# Patient Record
Sex: Female | Born: 1955 | Race: White | Hispanic: No | Smoking: Former smoker
Health system: Southern US, Community
[De-identification: ages and names within clinical notes are randomized; demographics above are authoritative.]

## PROBLEM LIST (undated history)

## (undated) DIAGNOSIS — I890 Lymphedema, not elsewhere classified: Secondary | ICD-10-CM

## (undated) DIAGNOSIS — E039 Hypothyroidism, unspecified: Secondary | ICD-10-CM

## (undated) DIAGNOSIS — I10 Essential (primary) hypertension: Secondary | ICD-10-CM

## (undated) DIAGNOSIS — R7303 Prediabetes: Secondary | ICD-10-CM

## (undated) DIAGNOSIS — G629 Polyneuropathy, unspecified: Secondary | ICD-10-CM

## (undated) DIAGNOSIS — I878 Other specified disorders of veins: Secondary | ICD-10-CM

## (undated) DIAGNOSIS — E785 Hyperlipidemia, unspecified: Secondary | ICD-10-CM

## (undated) DIAGNOSIS — R8789 Other abnormal findings in specimens from female genital organs: Principal | ICD-10-CM

## (undated) HISTORY — DX: Hyperlipidemia, unspecified: E78.5

## (undated) HISTORY — DX: Hypothyroidism, unspecified: E03.9

## (undated) HISTORY — DX: Prediabetes: R73.03

## (undated) HISTORY — DX: Other abnormal findings in specimens from female genital organs: R87.89

## (undated) HISTORY — PX: LEG SURGERY: SHX1003

## (undated) HISTORY — DX: Essential (primary) hypertension: I10

## (undated) HISTORY — DX: Lymphedema, not elsewhere classified: I89.0

## (undated) HISTORY — DX: Other specified disorders of veins: I87.8

## (undated) HISTORY — DX: Polyneuropathy, unspecified: G62.9

---

## 1987-01-16 HISTORY — PX: CHOLECYSTECTOMY: SHX55

## 2013-12-07 ENCOUNTER — Ambulatory Visit: Payer: Self-pay

## 2014-09-14 ENCOUNTER — Ambulatory Visit (INDEPENDENT_AMBULATORY_CARE_PROVIDER_SITE_OTHER): Payer: Medicaid Other

## 2014-09-14 ENCOUNTER — Ambulatory Visit (INDEPENDENT_AMBULATORY_CARE_PROVIDER_SITE_OTHER): Payer: Medicaid Other | Admitting: Orthopedic Surgery

## 2014-09-14 VITALS — BP 125/79 | Ht 76.0 in | Wt 356.0 lb

## 2014-09-14 DIAGNOSIS — M542 Cervicalgia: Secondary | ICD-10-CM | POA: Diagnosis not present

## 2014-09-14 DIAGNOSIS — M47812 Spondylosis without myelopathy or radiculopathy, cervical region: Secondary | ICD-10-CM | POA: Diagnosis not present

## 2014-09-14 NOTE — Patient Instructions (Addendum)
Continue NSAIDS  CALL [facility] TO SCHEDULE THERAPY VISITS

## 2014-09-14 NOTE — Progress Notes (Signed)
Patient ID: Dana Koch, female   DOB: [birth year], 59 y.o.   MRN: [id number]   Chief Complaint  Patient presents with  . Torticollis    Arm and neck pain, referred by L. Strader for eval and treat.    Kalliope Riesen is a 59 y.o. female.   HPI 59 year old female presents with long history of cervical spine discomfort and pain stiffness and soreness without radicular symptoms. She has failed therapy with Tylenol and ibuprofen also had Lodine no therapy at this point. Denies any numbness tingling or weakness in her upper extremities but her pain is constant  She also ruptured her proximal biceps in June of this year. She does have neuropathy in her legs seasonal allergies chronic back pain ankle leg edema diarrhea status post cholecystectomy Review of Systems See hpi  No past medical history on file.  Allergies  Allergen Reactions  . Amoxicillin Hives  . Demerol [Meperidine] Other (See Comments)    Stops my heart    No current outpatient prescriptions on file.   No current facility-administered medications for this visit.     Physical Exam Blood pressure 125/79, height  (1.93 m), weight 356 lb (161.481 kg). Physical Exam  The patient is well developed well nourished and well groomed.   Orientation to person place and time is normal   Mood is pleasant.  Ambulatory status ambulatory with assistive device  Inspection: Cervical spine range of motion is normal with mild discomfort mild tenderness. Upper extremities shoulder elbow wrist stable bilaterally motor exam normal bilaterally skin and neurovascular exam intact bilaterally no lymphadenopathy on either axilla or supraclavicular region   Data Reviewed Order xrays yes Read xrays  cervical spondylosis Reports reviewed: no  Diagnosis  New without further work up yes Encounter Diagnoses  Name Primary?  . Neck pain   . Cervical spondylosis without myelopathy Yes      Management Recommend physical therapy  the patient is not a surgical candidate if she's not having any radicular symptoms avoid narcotics use local measures as needed. Follow-up as needed. Patient sent for PT

## 2014-09-23 ENCOUNTER — Ambulatory Visit (HOSPITAL_COMMUNITY): Payer: Medicaid Other | Attending: Orthopedic Surgery

## 2014-09-23 DIAGNOSIS — M542 Cervicalgia: Secondary | ICD-10-CM | POA: Diagnosis present

## 2014-09-23 DIAGNOSIS — R293 Abnormal posture: Secondary | ICD-10-CM | POA: Insufficient documentation

## 2014-09-23 DIAGNOSIS — M47812 Spondylosis without myelopathy or radiculopathy, cervical region: Secondary | ICD-10-CM | POA: Diagnosis not present
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
## 2014-09-23 NOTE — Patient Instructions (Signed)
Flexibility: Neck Retraction     Neck retractions: slide head straight back as if a train on rails, avoiding tilting head up or down. Hold for 3 seconds and gently relax.  Perform 3x/day.   Copyright  VHI. All rights reserved.   Scapular Retraction (Standing)   With arms at sides, pinch shoulder blades together. Repeat ____ times per set. Do ____ sets per session. Do ____ sessions per day.  http://orth.exer.us/945   Copyright  VHI. All rights reserved.     Extension over chair.  Perform with chair back at should blade level. Extend over chair 10-15 times, 2-3x per day.

## 2014-09-30 ENCOUNTER — Encounter (HOSPITAL_COMMUNITY): Payer: Medicaid Other | Admitting: Physical Therapy

## 2014-10-05 ENCOUNTER — Encounter (HOSPITAL_COMMUNITY): Payer: Medicaid Other | Admitting: Physical Therapy

## 2014-10-13 ENCOUNTER — Encounter (HOSPITAL_COMMUNITY): Payer: Medicaid Other | Admitting: Physical Therapy

## 2014-10-20 ENCOUNTER — Encounter (HOSPITAL_COMMUNITY): Payer: Medicaid Other

## 2015-02-11 ENCOUNTER — Encounter: Payer: Self-pay | Admitting: *Deleted

## 2015-02-11 ENCOUNTER — Ambulatory Visit: Payer: Medicaid Other | Admitting: Cardiovascular Disease

## 2015-02-14 ENCOUNTER — Ambulatory Visit (INDEPENDENT_AMBULATORY_CARE_PROVIDER_SITE_OTHER): Payer: Medicaid Other | Admitting: Cardiovascular Disease

## 2015-02-14 ENCOUNTER — Encounter: Payer: Self-pay | Admitting: Cardiovascular Disease

## 2015-02-14 VITALS — BP 124/94 | HR 78 | Ht 76.0 in | Wt 367.0 lb

## 2015-02-14 DIAGNOSIS — R5383 Other fatigue: Secondary | ICD-10-CM | POA: Diagnosis not present

## 2015-02-14 DIAGNOSIS — Z789 Other specified health status: Secondary | ICD-10-CM

## 2015-02-14 DIAGNOSIS — R0602 Shortness of breath: Secondary | ICD-10-CM | POA: Diagnosis not present

## 2015-02-14 DIAGNOSIS — R9431 Abnormal electrocardiogram [ECG] [EKG]: Secondary | ICD-10-CM

## 2015-02-14 DIAGNOSIS — Z889 Allergy status to unspecified drugs, medicaments and biological substances status: Secondary | ICD-10-CM | POA: Diagnosis not present

## 2015-02-14 MED ORDER — EZETIMIBE 10 MG PO TABS: 10.0000 mg | ORAL_TABLET | Freq: Every day | ORAL | Status: DC

## 2015-02-14 NOTE — Progress Notes (Signed)
Patient ID: Dana Koch, female   DOB: 03/20/1955, 60 y.o.   MRN: [id number]       CARDIOLOGY CONSULT NOTE  Patient ID: Dana Koch MRN: [id number] DOB/AGE: Jun 06, 1955 60 y.o.  Admit date: (Not on file) Primary Physician Erasmo Downer, NP  Reason for Consultation:  Shortness of breath, fatigue  HPI: The patient is a 60 year old woman with morbid obesity, hypertension, hypothyroidism, and hyperlipidemia.  I reviewed labs from 11/23/14 which showed hemoglobin normal at 12.9.  She has been experiencing shortness of breath and fatigue. She denies orthopnea and paroxysmal nocturnal dyspnea. She has chronic lymphedema. Labs on 01/13/15 showed BUN 13, creatinine 0.77 , total cholesterol 190, triglycerides 283, HDL 55, LDL 78 , TSH 3.52, free T4 1 0.13.   ECG performed in the office today demonstrates sinus rhythm with a nonspecific T wave abnormality.  She tells me all of her symptoms began when she started simvastatin. She began to feel short of breath, fatigued, and her "legs felt like lead". She had diffuse myalgias. She then stopped it altogether for 2 weeks and felt very well with no symptoms of shortness of breath and fatigue. She was then started on Lipitor approximately one month ago and her symptoms returned. She said , "I am not used to chemicals in my body". She denies exertional chest tightness period she is due to have lipids rechecked in March by her PCP.    Allergies  Allergen Reactions  . Amoxicillin Hives  . Demerol [Meperidine] Other (See Comments)    Stops my heart    Current Outpatient Prescriptions  Medication Sig Dispense Refill  . atorvastatin (LIPITOR) 20 MG tablet Take 20 mg by mouth daily.    . hydrochlorothiazide (HYDRODIURIL) 25 MG tablet Take 25 mg by mouth daily.    Marland Kitchen levothyroxine (SYNTHROID, LEVOTHROID) 175 MCG tablet Take 175 mcg by mouth daily before breakfast.     . potassium chloride SA (K-DUR,KLOR-CON) 20 MEQ tablet Take 20 mEq by mouth  daily.      No current facility-administered medications for this visit.    Past Medical History  Diagnosis Date  . Hypertension   . Hypothyroidism   . Hyperlipidemia   . Neuropathy (HCC)   . Lymphedema   . Venous stasis     Past Surgical History  Procedure Laterality Date  . Cholecystectomy  1989  . Leg surgery Left     broken left leg    Social History   Social History  . Marital Status: Married    Spouse Name: N/A  . Number of Children: N/A  . Years of Education: N/A   Occupational History  . Not on file.   Social History Main Topics  . Smoking status: Former Smoker    Types: Cigarettes    Quit date: 01/03/2013  . Smokeless tobacco: Never Used  . Alcohol Use: Not on file  . Drug Use: Not on file  . Sexual Activity: Not on file   Other Topics Concern  . Not on file   Social History Narrative     No family history of premature CAD in 1st degree relatives.  Prior to Admission medications   Medication Sig Start Date End Date Taking? Authorizing Provider  atorvastatin (LIPITOR) 20 MG tablet Take 20 mg by mouth daily.   Yes Historical Provider, MD  hydrochlorothiazide (HYDRODIURIL) 25 MG tablet Take 25 mg by mouth daily.   Yes Historical Provider, MD  levothyroxine (SYNTHROID, LEVOTHROID) 175 MCG tablet Take 175 mcg by mouth  daily before breakfast.    Yes Historical Provider, MD  potassium chloride SA (K-DUR,KLOR-CON) 20 MEQ tablet Take 20 mEq by mouth daily.    Yes Historical Provider, MD     Review of systems complete and found to be negative unless listed above in HPI     Physical exam Blood pressure 124/94, pulse 78, height  (1.93 m), weight 367 lb (166.47 kg), SpO2 92 %. General: NAD Neck: No JVD, no thyromegaly or thyroid nodule.  Lungs: Clear to auscultation bilaterally with normal respiratory effort. CV: Nondisplaced PMI. Regular rate and rhythm, normal S1/S2, no S3/S4, no murmur.  Trace periankle and pretibial edema b/l with chronic  venous stasis.    Abdomen: Obese. Neurologic: Alert and oriented x 3.  Psych: Normal affect. Extremities: No clubbing or cyanosis.  HEENT: Normal.   ECG: Most recent ECG reviewed.  Labs:  No results found for: WBC, HGB, HCT, MCV, PLT No results for input(s): NA, K, CL, CO2, BUN, CREATININE, CALCIUM, PROT, BILITOT, ALKPHOS, ALT, AST, GLUCOSE in the last 168 hours.  Invalid input(s): LABALBU No results found for: CKTOTAL, CKMB, CKMBINDEX, TROPONINI No results found for: CHOL No results found for: HDL No results found for: LDLCALC No results found for: TRIG No results found for: CHOLHDL No results found for: LDLDIRECT       Studies: No results found.  ASSESSMENT AND PLAN:  1. Exertional dyspnea and fatigue: As she did not have these symptoms prior to commencing statin therapy, it is likely that her symptoms are due to medications. I will discontinue Lipitor and start Zetia 10 mg daily. If she were to have continued symptoms at her next follow-up visit in 2 months, I would then consider stress testing. ECG demonstrates a nonspecific T wave abnormality.  2. Hyperlipidemia: As per #1, will d/c Lipitor and start Zetia as symptoms appear to be statin-related. Will need repeat lipids in 2 months.  Dispo: f/u 2 months.  Signed: Prentice Docker, M.D., F.A.C.C.  02/14/2015, 10:33 AM

## 2015-02-14 NOTE — Patient Instructions (Addendum)
   Stop Lipitor  Begin Zetia  daily - new sent to [facility] today.  Continue all other medications.   Follow up in  2 months.

## 2015-03-04 ENCOUNTER — Telehealth: Payer: Self-pay | Admitting: Cardiovascular Disease

## 2015-03-04 NOTE — Telephone Encounter (Signed)
Pt aware.

## 2015-03-04 NOTE — Telephone Encounter (Signed)
PT has SOB , muscle pain in legs with Zetia. Please advise

## 2015-03-04 NOTE — Telephone Encounter (Signed)
pls call pt concerning her Zetia and Atorvastatin she seems to be experiencing some side effects from these 2

## 2015-03-04 NOTE — Telephone Encounter (Signed)
Can stop Zetia and try red rice yeast extract.

## 2015-04-20 ENCOUNTER — Encounter: Payer: Self-pay | Admitting: Cardiovascular Disease

## 2015-04-20 ENCOUNTER — Ambulatory Visit (INDEPENDENT_AMBULATORY_CARE_PROVIDER_SITE_OTHER): Payer: Medicaid Other | Admitting: Cardiovascular Disease

## 2015-04-20 VITALS — BP 132/83 | HR 68 | Ht 76.0 in | Wt 373.0 lb

## 2015-04-20 DIAGNOSIS — Z889 Allergy status to unspecified drugs, medicaments and biological substances status: Secondary | ICD-10-CM | POA: Diagnosis not present

## 2015-04-20 DIAGNOSIS — E785 Hyperlipidemia, unspecified: Secondary | ICD-10-CM

## 2015-04-20 DIAGNOSIS — R0602 Shortness of breath: Secondary | ICD-10-CM | POA: Diagnosis not present

## 2015-04-20 DIAGNOSIS — Z789 Other specified health status: Secondary | ICD-10-CM

## 2015-04-20 DIAGNOSIS — R9431 Abnormal electrocardiogram [ECG] [EKG]: Secondary | ICD-10-CM | POA: Diagnosis not present

## 2015-04-20 DIAGNOSIS — R5383 Other fatigue: Secondary | ICD-10-CM

## 2015-04-20 NOTE — Patient Instructions (Signed)
Your physician recommends that you schedule a follow-up appointment AS NEEDED WITH DR. KONESWARAN  Your physician recommends that you continue on your current medications as directed. Please refer to the Current Medication list given to you today.  Thank you for choosing [facility]!!   

## 2015-04-20 NOTE — Progress Notes (Signed)
Patient ID: Dana Koch, female   DOB: Feb 18, 1955, 60 y.o.   MRN: [id number]      SUBJECTIVE: The patient returns for follow up for hyperlipidemia. 03/30/15 labs show TC 225, TG 234, HDL 52, LDL 126. Developed shortness of breath and fatigue with simvastatin, atorvastatin, and Zetia. "Could not find" red rice yeast extract. She now believes her symptoms are from cardiovascular deconditioning and wants to begin walking. She also eats quite a bit of sausage and wants to change her diet even more so. PCP started lisinopril but she doesn't want to take it. BP normal today on HCTZ.   Review of Systems: As per "subjective", otherwise negative.  Allergies  Allergen Reactions  . Amoxicillin Hives  . Demerol [Meperidine] Other (See Comments)    Stops my heart  . Statins Other (See Comments)    sob    Current Outpatient Prescriptions  Medication Sig Dispense Refill  . hydrochlorothiazide (HYDRODIURIL) 25 MG tablet Take 25 mg by mouth daily.    Marland Kitchen. levothyroxine (SYNTHROID, LEVOTHROID) 175 MCG tablet Take 175 mcg by mouth daily before breakfast.     . potassium chloride SA (K-DUR,KLOR-CON) 20 MEQ tablet Take 20 mEq by mouth daily.      No current facility-administered medications for this visit.    Past Medical History  Diagnosis Date  . Hypertension   . Hypothyroidism   . Hyperlipidemia   . Neuropathy (HCC)   . Lymphedema   . Venous stasis     Past Surgical History  Procedure Laterality Date  . Cholecystectomy  1989  . Leg surgery Left     broken left leg    Social History   Social History  . Marital Status: Married    Spouse Name: N/A  . Number of Children: N/A  . Years of Education: N/A   Occupational History  . Not on file.   Social History Main Topics  . Smoking status: Former Smoker -- 2.00 packs/day for 40 years    Types: Cigarettes    Start date: 12/04/1972    Quit date: 01/03/2013  . Smokeless tobacco: Never Used  . Alcohol Use: Not on file  . Drug Use:  Not on file  . Sexual Activity: Not on file   Other Topics Concern  . Not on file   Social History Narrative     Filed Vitals:   04/20/15 0937  BP: 132/83  Pulse: 68  Height: 6\' 4"  (1.93 m)  Weight: 373 lb (169.192 kg)    PHYSICAL EXAM General: NAD Neck: No JVD, no thyromegaly or thyroid nodule.  Lungs: Clear to auscultation bilaterally with normal respiratory effort. CV: Nondisplaced PMI. Regular rate and rhythm, normal S1/S2, no S3/S4, no murmur. Trace periankle and pretibial edema b/l with chronic venous stasis.  Abdomen: Obese. Neurologic: Alert and oriented x 3.  Psych: Normal affect. Extremities: No clubbing or cyanosis.  HEENT: Normal.   ECG: Most recent ECG reviewed.      ASSESSMENT AND PLAN: 1. Exertional dyspnea and fatigue: Symptoms may be related to both CV deconditioning due to chronic pain as well as medications as noted above. Instructed to call me back if symptoms persist in spite of weight loss and CV conditioning. If so, would consider stress testing. Patient in agreement with plan.  2. Hyperlipidemia: Lipids reviewed above. Recommend red rice yeast extract given multiple medication intolerances. Also discussed weight loss and dietary modification.  Dispo: f/u prn.   Prentice DockerSuresh Koneswaran, M.D., F.A.C.C.

## 2015-05-02 ENCOUNTER — Other Ambulatory Visit: Payer: Self-pay

## 2015-05-02 DIAGNOSIS — I872 Venous insufficiency (chronic) (peripheral): Secondary | ICD-10-CM

## 2015-06-20 ENCOUNTER — Encounter: Payer: Medicaid Other | Admitting: Vascular Surgery

## 2015-06-20 ENCOUNTER — Encounter (HOSPITAL_COMMUNITY): Payer: Medicaid Other

## 2015-07-15 ENCOUNTER — Encounter (HOSPITAL_COMMUNITY): Payer: Medicaid Other

## 2015-07-15 ENCOUNTER — Encounter: Payer: Medicaid Other | Admitting: Vascular Surgery

## 2016-02-20 ENCOUNTER — Encounter (INDEPENDENT_AMBULATORY_CARE_PROVIDER_SITE_OTHER): Payer: Self-pay | Admitting: Vascular Surgery

## 2016-02-20 ENCOUNTER — Ambulatory Visit (INDEPENDENT_AMBULATORY_CARE_PROVIDER_SITE_OTHER): Payer: Medicaid Other | Admitting: Vascular Surgery

## 2016-02-20 DIAGNOSIS — M79605 Pain in left leg: Secondary | ICD-10-CM

## 2016-02-20 DIAGNOSIS — I10 Essential (primary) hypertension: Secondary | ICD-10-CM | POA: Diagnosis not present

## 2016-02-20 DIAGNOSIS — I872 Venous insufficiency (chronic) (peripheral): Secondary | ICD-10-CM | POA: Insufficient documentation

## 2016-02-20 DIAGNOSIS — M79606 Pain in leg, unspecified: Secondary | ICD-10-CM | POA: Insufficient documentation

## 2016-02-20 DIAGNOSIS — I890 Lymphedema, not elsewhere classified: Secondary | ICD-10-CM | POA: Diagnosis not present

## 2016-02-20 DIAGNOSIS — M79604 Pain in right leg: Secondary | ICD-10-CM | POA: Diagnosis not present

## 2016-02-20 DIAGNOSIS — E039 Hypothyroidism, unspecified: Secondary | ICD-10-CM | POA: Insufficient documentation

## 2016-02-20 NOTE — Progress Notes (Signed)
MRN : [id number]  Dana Koch is a 61 y.o. ([birth year]) female who presents with chief complaint of  Chief Complaint  Patient presents with  . Re-evaluation    6 month follow up  .  History of Present Illness: The patient returns to the office for followup evaluation regarding leg swelling. The swelling has persisted and the pain associated with swelling continues. There have not been any interval development of a ulcerations or wounds.  Since the previous visit the patient has been wearing graduated compression stockings and has noted significant improvement in the lymphedema.  Duplex ultrasound of the lower extremities shows normal deep venous system, superficial reflux is not present.   Current Meds  Medication Sig  . hydrochlorothiazide (HYDRODIURIL) 25 MG tablet Take 25 mg by mouth daily.  Marland Kitchen ibuprofen (ADVIL,MOTRIN) 600 MG tablet Take 600 mg by mouth.  . levothyroxine (SYNTHROID, LEVOTHROID) 175 MCG tablet Take 175 mcg by mouth daily before breakfast.   . potassium chloride SA (K-DUR,KLOR-CON) 20 MEQ tablet Take 20 mEq by mouth daily.     Past Medical History:  Diagnosis Date  . Hyperlipidemia   . Hypertension   . Hypothyroidism   . Lymphedema   . Neuropathy (HCC)   . Venous stasis     Past Surgical History:  Procedure Laterality Date  . CHOLECYSTECTOMY  1989  . LEG SURGERY Left    broken left leg    Social History Social History  Substance Use Topics  . Smoking status: Former Smoker    Packs/day: 2.00    Years: 40.00    Types: Cigarettes    Start date: 12/04/1972    Quit date: 01/03/2013  . Smokeless tobacco: Never Used  . Alcohol use Not on file    Family History Family History  Problem Relation Age of Onset  . Thyroid disease Mother   . Bone cancer Mother   . Thyroid disease Father   . Emphysema Father   . Diabetes Father   . Neuropathy Father   . Diabetes Brother   . Thyroid disease Brother   . Thyroid disease Sister   No family  history of bleeding/clotting disorders, porphyria or autoimmune disease  Allergies  Allergen Reactions  . Amoxicillin Hives  . Demerol [Meperidine] Other (See Comments)    Stops my heart  . Statins Other (See Comments)    sob     REVIEW OF SYSTEMS (Negative unless checked)  Constitutional: [] Weight loss  [] Fever  [] Chills Cardiac: [] Chest pain   [] Chest pressure   [] Palpitations   [] Shortness of breath when laying flat   [] Shortness of breath with exertion. Vascular:  [] Pain in legs with walking   [] Pain in legs at rest  [] History of DVT   [] Phlebitis   [x] Swelling in legs   [] Varicose veins   [] Non-healing ulcers Pulmonary:   [] Uses home oxygen   [] Productive cough   [] Hemoptysis   [] Wheeze  [] COPD   [] Asthma Neurologic:  [] Dizziness   [] Seizures   [] History of stroke   [] History of TIA  [] Aphasia   [] Vissual changes   [] Weakness or numbness in arm   [] Weakness or numbness in leg Musculoskeletal:   [] Joint swelling   [] Joint pain   [] Low back pain Hematologic:  [] Easy bruising  [] Easy bleeding   [] Hypercoagulable state   [] Anemic Gastrointestinal:  [] Diarrhea   [] Vomiting  [] Gastroesophageal reflux/heartburn   [] Difficulty swallowing. Genitourinary:  [] Chronic kidney disease   [] Difficult urination  [] Frequent urination   [] Blood in urine Skin:  []   Rashes   [] Ulcers  Psychological:  [] History of anxiety   []  History of major depression.  Physical Examination  Vitals:   02/20/16 1018  BP: (!) 147/94  Pulse: 91  Resp: 16  Weight: (!) 363 lb (164.7 kg)  Height: 6\' 3"  (1.905 m)   Body mass index is 45.37 kg/m. Gen: WD/WN, NAD Head: [place]/AT, No temporalis wasting.  Ear/Nose/Throat: Hearing grossly intact, nares w/o erythema or drainage, poor dentition Eyes: PER, EOMI, sclera nonicteric.  Neck: Supple, no masses.  No bruit or JVD.  Pulmonary:  Good air movement, clear to auscultation bilaterally, no use of accessory muscles.  Cardiac: RRR, normal S1, S2, no Murmurs. Vascular: 2+  edema bilaterally mild venous stasis skin changes Vessel Right Left  Radial Palpable Palpable  Ulnar Palpable Palpable  Brachial Palpable Palpable  Carotid Palpable Palpable  Femoral Palpable Palpable  Popliteal Palpable Palpable  PT Palpable Palpable  DP Palpable Palpable   Gastrointestinal: soft, non-distended. No guarding/no peritoneal signs.  Musculoskeletal: M/S 5/5 throughout.  No deformity or atrophy.  Neurologic: CN 2-12 intact. Pain and light touch intact in extremities.  Symmetrical.  Speech is fluent. Motor exam as listed above. Psychiatric: Judgment intact, Mood & affect appropriate for pt's clinical situation. Dermatologic: No rashes or ulcers noted.  No changes consistent with cellulitis. Lymph : No Cervical lymphadenopathy, no lichenification or skin changes of chronic lymphedema.  CBC No results found for: WBC, HGB, HCT, MCV, PLT  BMET No results found for: NA, K, CL, CO2, GLUCOSE, BUN, CREATININE, CALCIUM, GFRNONAA, GFRAA CrCl cannot be calculated (No order found.).  COAG No results found for: INR, PROTIME  Radiology No results found.  Assessment/Plan 1. Lymph edema  No surgery or intervention at this point in time.    I have reviewed my discussion with the patient regarding lymphedema and why it  causes symptoms.  Patient will continue wearing graduated compression stockings class 1 (20-30 mmHg) on a daily basis a prescription was given. The patient is reminded to put the stockings on first thing in the morning and removing them in the evening. The patient is instructed specifically not to sleep in the stockings.   In addition, behavioral modification throughout the day will be continued.  This will include frequent elevation (such as in a recliner), use of over the counter pain medications as needed and exercise such as walking.  I have reviewed systemic causes for chronic edema such as liver, kidney and cardiac etiologies and there does not appear to be any  significant changes in these organ systems over the past year.  The patient is under the impression that these organ systems are all stable and unchanged.    The patient will continue aggressive use of the  lymph pump.  This will continue to improve the edema control and prevent sequela such as ulcers and infections.   The patient will follow-up with me on an annual basis.    2. Chronic venous insufficiency See #1  3. Pain in both lower extremities See #1  4. Essential hypertension Continue antihypertensive medications as already ordered, these medications have been reviewed and there are no changes at this time.   5. Hypothyroidism, unspecified type Continue Synthroid as already ordered, these medications have been reviewed and there are no changes at this time.   Levora DredgeGregory Schnier, MD  02/20/2016 10:35 AM

## 2016-02-29 ENCOUNTER — Encounter: Payer: Self-pay | Admitting: Family Medicine

## 2016-02-29 ENCOUNTER — Ambulatory Visit (INDEPENDENT_AMBULATORY_CARE_PROVIDER_SITE_OTHER): Payer: Medicaid Other | Admitting: Family Medicine

## 2016-02-29 VITALS — BP 135/76 | HR 79 | Temp 96.9°F | Ht 75.0 in | Wt 365.8 lb

## 2016-02-29 DIAGNOSIS — E039 Hypothyroidism, unspecified: Secondary | ICD-10-CM | POA: Diagnosis not present

## 2016-02-29 DIAGNOSIS — I10 Essential (primary) hypertension: Secondary | ICD-10-CM

## 2016-02-29 DIAGNOSIS — E785 Hyperlipidemia, unspecified: Secondary | ICD-10-CM

## 2016-02-29 NOTE — Progress Notes (Signed)
BP 135/76   Pulse 79   Temp (!) 96.9 F (36.1 C) (Oral)   Ht 6' 3"  (1.905 m)   Wt (!) 365 lb 12.8 oz (165.9 kg)   BMI 45.72 kg/m    Subjective:    Patient ID: Dana Koch, female    DOB: 13-Mar-1955, 61 y.o.   MRN: [id number]  HPI: Dana Koch is a 61 y.o. female presenting on 02/29/2016 for New Patient (Initial Visit)   HPI Hypertension check Patient is coming in today for a hypertension check and to establish care with our office as a new patient. Her blood pressure today is 135/76. She has been on hydrochlorothiazide prior to coming here and denies any issues with it. Patient denies headaches, blurred vision, chest pains, shortness of breath, or weakness. Denies any side effects from medication and is content with current medication.   Hypothyroidism Patient is coming in for a checkup on hypothyroidism and is currently on levothyroxine 200 g. She has been on that for at least a year. She has not had her labs checked in at least a year. She is having some fatigue but denies any palpitations or constipation or diarrhea or chest pain or changes in her hair or nails  Hyperlipidemia Patient is currently on red rice yeast extract for her cholesterol. She denies any issue with chest pain or focal numbness or weakness or visual deficits that are new.  Relevant past medical, surgical, family and social history reviewed and updated as indicated. Interim medical history since our last visit reviewed. Allergies and medications reviewed and updated.  Review of Systems  Constitutional: Negative for chills and fever.  HENT: Negative for congestion, ear discharge and ear pain.   Eyes: Negative for visual disturbance.  Respiratory: Negative for chest tightness and shortness of breath.   Cardiovascular: Negative for chest pain and leg swelling.  Musculoskeletal: Negative for back pain and gait problem.  Skin: Negative for rash.  Neurological: Negative for dizziness, light-headedness and  headaches.  Psychiatric/Behavioral: Negative for agitation and behavioral problems.  All other systems reviewed and are negative.  Per HPI unless specifically indicated above  Social History   Social History  . Marital status: Married    Spouse name: N/A  . Number of children: N/A  . Years of education: N/A   Occupational History  . Not on file.   Social History Main Topics  . Smoking status: Former Smoker    Packs/day: 3.00    Years: 40.00    Types: Cigarettes    Start date: 12/04/1972    Quit date: 01/03/2013  . Smokeless tobacco: Never Used  . Alcohol use No  . Drug use: No  . Sexual activity: No   Other Topics Concern  . Not on file   Social History Narrative  . No narrative on file    Past Surgical History:  Procedure Laterality Date  . CHOLECYSTECTOMY  1989  . LEG SURGERY Left    broken left leg    Family History  Problem Relation Age of Onset  . Thyroid disease Mother   . Bone cancer Mother   . Thyroid disease Father   . Emphysema Father   . Diabetes Father   . Neuropathy Father   . Diabetes Brother   . Thyroid disease Brother   . Thyroid disease Sister         Objective:    BP 135/76   Pulse 79   Temp (!) 96.9 F (36.1 C) (Oral)  Ht 6' 3"  (1.905 m)   Wt (!) 365 lb 12.8 oz (165.9 kg)   BMI 45.72 kg/m   Wt Readings from Last 3 Encounters:  02/29/16 (!) 365 lb 12.8 oz (165.9 kg)  02/20/16 (!) 363 lb (164.7 kg)  04/20/15 (!) 373 lb (169.2 kg)    Physical Exam  Constitutional: She is oriented to person, place, and time. She appears well-developed and well-nourished. No distress.  Eyes: Conjunctivae are normal.  Neck: Neck supple. No thyromegaly present.  Cardiovascular: Normal rate, regular rhythm, normal heart sounds and intact distal pulses.   No murmur heard. Pulmonary/Chest: Effort normal and breath sounds normal. No respiratory distress. She has no wheezes.  Abdominal: Soft. Bowel sounds are normal. She exhibits no distension.  There is no tenderness. There is no rebound.  Musculoskeletal: Normal range of motion. She exhibits no edema or tenderness.  Lymphadenopathy:    She has no cervical adenopathy.  Neurological: She is alert and oriented to person, place, and time. Coordination normal.  Skin: Skin is warm and dry. No rash noted. She is not diaphoretic.  Psychiatric: She has a normal mood and affect. Her behavior is normal.  Nursing note and vitals reviewed.   No results found for this or any previous visit.    Assessment & Plan:   Problem List Items Addressed This Visit      Cardiovascular and Mediastinum   Essential hypertension - Primary   Relevant Orders   CMP14+EGFR     Endocrine   Hypothyroidism   Relevant Medications   levothyroxine (SYNTHROID, LEVOTHROID) 200 MCG tablet   Other Relevant Orders   TSH    Other Visit Diagnoses    Hyperlipidemia LDL goal <130       Relevant Orders   Lipid panel       Follow up plan: Return in about 6 months (around 08/28/2016), or if symptoms worsen or fail to improve, for hypothyroidism and cholesterol.  Caryl Pina, MD [facility] 02/29/2016, 9:42 AM

## 2016-03-01 LAB — CMP14+EGFR
A/G RATIO: 1.6 (ref 1.2–2.2)
ALT: 20 IU/L (ref 0–32)
AST: 19 IU/L (ref 0–40)
Albumin: 4.4 g/dL (ref 3.5–5.5)
Alkaline Phosphatase: 107 IU/L (ref 39–117)
BILIRUBIN TOTAL: 0.4 mg/dL (ref 0.0–1.2)
BUN/Creatinine Ratio: 14 (ref 9–23)
BUN: 11 mg/dL (ref 6–24)
CALCIUM: 9.9 mg/dL (ref 8.7–10.2)
CHLORIDE: 97 mmol/L (ref 96–106)
CO2: 28 mmol/L (ref 18–29)
Creatinine, Ser: 0.76 mg/dL (ref 0.57–1.00)
GFR calc Af Amer: 99 mL/min/{1.73_m2} (ref >59)
GFR, EST NON AFRICAN AMERICAN: 86 mL/min/{1.73_m2} (ref >59)
GLOBULIN, TOTAL: 2.8 g/dL (ref 1.5–4.5)
Glucose: 98 mg/dL (ref 65–99)
POTASSIUM: 4.4 mmol/L (ref 3.5–5.2)
SODIUM: 142 mmol/L (ref 134–144)
TOTAL PROTEIN: 7.2 g/dL (ref 6.0–8.5)

## 2016-03-01 LAB — LIPID PANEL
Chol/HDL Ratio: 4.4 ratio units (ref 0.0–4.4)
Cholesterol, Total: 206 mg/dL — ABNORMAL HIGH (ref 100–199)
HDL: 47 mg/dL (ref >39)
LDL Calculated: 119 mg/dL — ABNORMAL HIGH (ref 0–99)
Triglycerides: 202 mg/dL — ABNORMAL HIGH (ref 0–149)
VLDL Cholesterol Cal: 40 mg/dL (ref 5–40)

## 2016-03-01 LAB — TSH: TSH: 0.959 u[IU]/mL (ref 0.450–4.500)

## 2016-03-30 ENCOUNTER — Other Ambulatory Visit: Payer: Self-pay

## 2016-03-30 MED ORDER — POTASSIUM CHLORIDE CRYS ER 20 MEQ PO TBCR: 20.0000 meq | EXTENDED_RELEASE_TABLET | Freq: Every day | ORAL | 1 refills | Status: DC

## 2016-05-07 ENCOUNTER — Other Ambulatory Visit: Payer: Self-pay | Admitting: Family Medicine

## 2016-06-01 ENCOUNTER — Other Ambulatory Visit: Payer: Self-pay | Admitting: Family Medicine

## 2016-06-08 ENCOUNTER — Telehealth: Payer: Self-pay

## 2016-06-08 ENCOUNTER — Other Ambulatory Visit: Payer: Self-pay

## 2016-06-08 MED ORDER — HYDROCHLOROTHIAZIDE 25 MG PO TABS: 25.0000 mg | ORAL_TABLET | Freq: Every day | ORAL | 1 refills | Status: DC

## 2016-06-08 NOTE — Telephone Encounter (Signed)
error 

## 2016-06-15 ENCOUNTER — Telehealth: Payer: Self-pay | Admitting: Family Medicine

## 2016-06-15 NOTE — Telephone Encounter (Signed)
Denied.

## 2016-06-27 ENCOUNTER — Other Ambulatory Visit: Payer: Self-pay | Admitting: Family Medicine

## 2016-06-27 ENCOUNTER — Encounter: Payer: Self-pay | Admitting: Family Medicine

## 2016-06-27 ENCOUNTER — Encounter: Payer: Self-pay | Admitting: *Deleted

## 2016-06-28 MED ORDER — POTASSIUM CHLORIDE CRYS ER 20 MEQ PO TBCR: 20.0000 meq | EXTENDED_RELEASE_TABLET | Freq: Every day | ORAL | 2 refills | Status: DC

## 2016-08-29 ENCOUNTER — Ambulatory Visit: Payer: Medicaid Other | Admitting: Family Medicine

## 2016-08-30 ENCOUNTER — Ambulatory Visit (INDEPENDENT_AMBULATORY_CARE_PROVIDER_SITE_OTHER): Payer: Medicaid Other | Admitting: Family Medicine

## 2016-08-30 ENCOUNTER — Encounter: Payer: Self-pay | Admitting: Family Medicine

## 2016-08-30 VITALS — BP 133/77 | HR 66 | Temp 97.6°F | Ht 75.0 in | Wt 362.0 lb

## 2016-08-30 DIAGNOSIS — Z13220 Encounter for screening for lipoid disorders: Secondary | ICD-10-CM

## 2016-08-30 DIAGNOSIS — E039 Hypothyroidism, unspecified: Secondary | ICD-10-CM

## 2016-08-30 DIAGNOSIS — I890 Lymphedema, not elsewhere classified: Secondary | ICD-10-CM | POA: Diagnosis not present

## 2016-08-30 DIAGNOSIS — I10 Essential (primary) hypertension: Secondary | ICD-10-CM

## 2016-08-30 DIAGNOSIS — R7309 Other abnormal glucose: Secondary | ICD-10-CM

## 2016-08-30 MED ORDER — FUROSEMIDE 20 MG PO TABS: 20.0000 mg | ORAL_TABLET | ORAL | 3 refills | Status: DC | PRN

## 2016-08-30 MED ORDER — LEVOTHYROXINE SODIUM 200 MCG PO TABS: 200.0000 ug | ORAL_TABLET | Freq: Every day | ORAL | 3 refills | Status: DC

## 2016-08-30 MED ORDER — POTASSIUM CHLORIDE CRYS ER 20 MEQ PO TBCR: 20.0000 meq | EXTENDED_RELEASE_TABLET | Freq: Every day | ORAL | 2 refills | Status: DC

## 2016-08-30 MED ORDER — HYDROCHLOROTHIAZIDE 12.5 MG PO TABS: 12.5000 mg | ORAL_TABLET | Freq: Every day | ORAL | 3 refills | Status: DC

## 2016-08-30 NOTE — Progress Notes (Signed)
BP 133/77   Pulse 66   Temp 97.6 F (36.4 C) (Oral)   Ht '6\' 3"'$  (1.905 m)   Wt (!) 362 lb (164.2 kg)   BMI 45.25 kg/m    Subjective:    Patient ID: Dana Koch, female    DOB: [birth year], 61 y.o.   MRN: [id number]  HPI: Dana Koch is a 61 y.o. female presenting on 08/30/2016 for Hypothyroidism (6 mo; would like to be tested for Hashimoto's, daughter and siser have been diagnosed) and Hypertension (episodes of hypotension )  HPI Hypothyroidism recheck Patient is coming in for thyroid recheck today as well. They deny any issues with hair changes or heat or cold problems or diarrhea or constipation. They deny any chest pain or palpitations. They are currently on levothyroxine 200 micrograms   Hypertension Patient is currently on hydrochlorothiazide, and their blood pressure today is 133/77. Patient denies any lightheadedness or dizziness. Patient denies headaches, blurred vision, chest pains, shortness of breath, or weakness. Denies any side effects from medication and is content with current medication.   Leg swelling/lymphedema recheck Patient is coming in today with complaints of leg swelling. She has been trying compression stockings although she does admit that she is not wearing them throughout the day of the time but does work in the morning and then gives off in the afternoon. She is trying to elevate her feet in the evenings as much she can but they still swell and she has a lot of pain in both of them from the swelling in the afternoons and evenings.  Relevant past medical, surgical, family and social history reviewed and updated as indicated. Interim medical history since our last visit reviewed. Allergies and medications reviewed and updated.  Review of Systems  Constitutional: Negative for chills and fever.  Eyes: Negative for redness and visual disturbance.  Respiratory: Negative for chest tightness and shortness of breath.   Cardiovascular: Positive for leg  swelling. Negative for chest pain.  Endocrine: Negative for cold intolerance, heat intolerance, polydipsia and polyuria.  Genitourinary: Negative for difficulty urinating and dysuria.  Musculoskeletal: Negative for back pain and gait problem.  Skin: Negative for rash.  Neurological: Negative for dizziness, light-headedness and headaches.  Psychiatric/Behavioral: Negative for agitation and behavioral problems.  All other systems reviewed and are negative.   Per HPI unless specifically indicated above     Objective:    BP 133/77   Pulse 66   Temp 97.6 F (36.4 C) (Oral)   Ht '6\' 3"'$  (1.905 m)   Wt (!) 362 lb (164.2 kg)   BMI 45.25 kg/m   Wt Readings from Last 3 Encounters:  08/30/16 (!) 362 lb (164.2 kg)  02/29/16 (!) 365 lb 12.8 oz (165.9 kg)  02/20/16 (!) 363 lb (164.7 kg)    Physical Exam  Constitutional: She is oriented to person, place, and time. She appears well-developed and well-nourished. No distress.  Eyes: Conjunctivae are normal.  Neck: Neck supple. No thyromegaly present.  Cardiovascular: Normal rate, regular rhythm, normal heart sounds and intact distal pulses.   No murmur heard. Pulmonary/Chest: Effort normal and breath sounds normal. No respiratory distress. She has no wheezes.  Musculoskeletal: Normal range of motion. She exhibits edema (2+). She exhibits no tenderness.  Lymphadenopathy:    She has no cervical adenopathy.  Neurological: She is alert and oriented to person, place, and time. Coordination normal.  Skin: Skin is warm and dry. No rash noted. She is not diaphoretic.  Psychiatric: She has a normal  mood and affect. Her behavior is normal.  Nursing note and vitals reviewed.     Assessment & Plan:   Problem List Items Addressed This Visit      Cardiovascular and Mediastinum   Essential hypertension   Relevant Medications   furosemide (LASIX) 20 MG tablet   hydrochlorothiazide (HYDRODIURIL) 12.5 MG tablet   potassium chloride SA (K-DUR,KLOR-CON)  20 MEQ tablet   Other Relevant Orders   CMP14+EGFR (Completed)     Endocrine   Hypothyroidism - Primary   Relevant Orders   Thyroid Panel With TSH (Completed)     Other   Lymph edema   Relevant Medications   furosemide (LASIX) 20 MG tablet    Other Visit Diagnoses    Need for lipid screening       Relevant Orders   Lipid panel (Completed)   Elevated glucose           Follow up plan: Return in about 6 months (around 03/02/2017), or if symptoms worsen or fail to improve, for Recheck thyroid and blood pressure.  Counseling provided for all of the vaccine components Orders Placed This Encounter  Procedures  . Thyroid Panel With TSH  . CMP14+EGFR  . Lipid panel    Caryl Pina, MD [facility] 08/30/2016, 9:13 AM

## 2016-08-31 ENCOUNTER — Encounter: Payer: Self-pay | Admitting: Family Medicine

## 2016-08-31 ENCOUNTER — Other Ambulatory Visit: Payer: Self-pay | Admitting: *Deleted

## 2016-08-31 DIAGNOSIS — R7309 Other abnormal glucose: Secondary | ICD-10-CM

## 2016-08-31 LAB — CMP14+EGFR
A/G RATIO: 1.5 (ref 1.2–2.2)
ALT: 31 IU/L (ref 0–32)
AST: 33 IU/L (ref 0–40)
Albumin: 4.7 g/dL (ref 3.5–5.5)
Alkaline Phosphatase: 99 IU/L (ref 39–117)
BUN/Creatinine Ratio: 12 (ref 9–23)
BUN: 9 mg/dL (ref 6–24)
Bilirubin Total: 0.4 mg/dL (ref 0.0–1.2)
CALCIUM: 10.1 mg/dL (ref 8.7–10.2)
CO2: 28 mmol/L (ref 20–29)
CREATININE: 0.77 mg/dL (ref 0.57–1.00)
Chloride: 97 mmol/L (ref 96–106)
GFR, EST AFRICAN AMERICAN: 98 mL/min/{1.73_m2} (ref >59)
GFR, EST NON AFRICAN AMERICAN: 85 mL/min/{1.73_m2} (ref >59)
GLOBULIN, TOTAL: 3.1 g/dL (ref 1.5–4.5)
Glucose: 108 mg/dL — ABNORMAL HIGH (ref 65–99)
POTASSIUM: 4.1 mmol/L (ref 3.5–5.2)
Sodium: 142 mmol/L (ref 134–144)
TOTAL PROTEIN: 7.8 g/dL (ref 6.0–8.5)

## 2016-08-31 LAB — THYROID PANEL WITH TSH
FREE THYROXINE INDEX: 2.8 (ref 1.2–4.9)
T3 Uptake Ratio: 27 % (ref 24–39)
T4, Total: 10.3 ug/dL (ref 4.5–12.0)
TSH: 1.85 u[IU]/mL (ref 0.450–4.500)

## 2016-08-31 LAB — LIPID PANEL
CHOL/HDL RATIO: 4.2 ratio (ref 0.0–4.4)
Cholesterol, Total: 220 mg/dL — ABNORMAL HIGH (ref 100–199)
HDL: 52 mg/dL (ref >39)
LDL CALC: 136 mg/dL — AB (ref 0–99)
Triglycerides: 162 mg/dL — ABNORMAL HIGH (ref 0–149)
VLDL Cholesterol Cal: 32 mg/dL (ref 5–40)

## 2016-09-03 ENCOUNTER — Other Ambulatory Visit: Payer: Self-pay | Admitting: Family Medicine

## 2016-09-03 DIAGNOSIS — E039 Hypothyroidism, unspecified: Secondary | ICD-10-CM

## 2016-09-03 LAB — BAYER DCA HB A1C WAIVED: HB A1C (BAYER DCA - WAIVED): 5.9 % (ref <7.0)

## 2016-09-05 ENCOUNTER — Other Ambulatory Visit: Payer: Self-pay | Admitting: *Deleted

## 2016-09-05 DIAGNOSIS — I10 Essential (primary) hypertension: Secondary | ICD-10-CM

## 2016-09-05 MED ORDER — HYDROCHLOROTHIAZIDE 12.5 MG PO TABS: 12.5000 mg | ORAL_TABLET | Freq: Every day | ORAL | 0 refills | Status: DC

## 2016-11-06 ENCOUNTER — Ambulatory Visit: Payer: Self-pay

## 2016-12-28 ENCOUNTER — Other Ambulatory Visit: Payer: Self-pay | Admitting: Family Medicine

## 2017-01-26 ENCOUNTER — Encounter: Payer: Self-pay | Admitting: Family Medicine

## 2017-02-21 ENCOUNTER — Ambulatory Visit (INDEPENDENT_AMBULATORY_CARE_PROVIDER_SITE_OTHER): Payer: Medicaid Other | Admitting: Vascular Surgery

## 2017-02-25 ENCOUNTER — Ambulatory Visit (INDEPENDENT_AMBULATORY_CARE_PROVIDER_SITE_OTHER): Payer: Medicaid Other | Admitting: Vascular Surgery

## 2017-03-04 ENCOUNTER — Ambulatory Visit: Payer: Medicaid Other | Admitting: Family Medicine

## 2017-03-04 ENCOUNTER — Encounter: Payer: Self-pay | Admitting: Family Medicine

## 2017-03-04 VITALS — BP 132/88 | HR 74 | Temp 98.5°F | Ht 75.0 in | Wt 378.0 lb

## 2017-03-04 DIAGNOSIS — I10 Essential (primary) hypertension: Secondary | ICD-10-CM | POA: Diagnosis not present

## 2017-03-04 DIAGNOSIS — E039 Hypothyroidism, unspecified: Secondary | ICD-10-CM | POA: Diagnosis not present

## 2017-03-04 NOTE — Progress Notes (Signed)
BP 132/88   Pulse 74   Temp 98.5 F (36.9 C) (Oral)   Ht _0  (1.905 m)   Wt (!) 378 lb (171.5 kg)   BMI 47.25 kg/m    Subjective:    Patient ID: Dana Koch, female    DOB: [birth year], 62 y.o.   MRN: [id number]  HPI: Dana Koch is a 62 y.o. female presenting on 03/04/2017 for Hypothyroidism (6 mo) and Hypertension   HPI Hypothyroidism recheck Patient is coming in for thyroid recheck today as well. They deny any issues with hair changes or heat or cold problems or diarrhea or constipation. They deny any chest pain or palpitations. They are currently on levothyroxine 232mcrograms   Hypertension Patient is currently on hydrochlorothiazide, and their blood pressure today is 132/80. Patient denies any lightheadedness or dizziness. Patient denies headaches, blurred vision, chest pains, shortness of breath, or weakness. Denies any side effects from medication and is content with current medication.   Relevant past medical, surgical, family and social history reviewed and updated as indicated. Interim medical history since our last visit reviewed. Allergies and medications reviewed and updated.  Review of Systems  Constitutional: Positive for unexpected weight change (Patient has been gradually gaining weight, she does admit that this is due to inactivity). Negative for chills and fever.  Eyes: Negative for visual disturbance.  Respiratory: Negative for chest tightness and shortness of breath.   Cardiovascular: Negative for chest pain and leg swelling.  Gastrointestinal: Negative for abdominal pain.  Musculoskeletal: Negative for back pain and gait problem.  Skin: Negative for rash.  Neurological: Negative for dizziness, light-headedness and headaches.  Psychiatric/Behavioral: Negative for agitation and behavioral problems.  All other systems reviewed and are negative.   Per HPI unless specifically indicated above   Allergies as of 03/04/2017      Reactions   Demerol  [meperidine] Other (See Comments)   Stops my heart   Amoxicillin Hives   Red Yeast Rice [cholestin] Itching   Statins Other (See Comments)   sob      Medication List        Accurate as of 03/04/17  8:04 AM. Always use your most recent med list.          CoQ10 400 MG Caps Take 400 mg by mouth.   furosemide 20 MG tablet Commonly known as:  LASIX Take 1 tablet (20 mg total) by mouth as needed.   hydrochlorothiazide 12.5 MG tablet Commonly known as:  HYDRODIURIL Take 1 tablet (12.5 mg total) by mouth daily.   ibuprofen 600 MG tablet Commonly known as:  ADVIL,MOTRIN Take 600 mg by mouth.   levothyroxine 200 MCG tablet Commonly known as:  SYNTHROID, LEVOTHROID TAKE 1 TABLET DAILY   Melatonin 5 MG Caps Take 5 mg by mouth at bedtime.   potassium chloride SA 20 MEQ tablet Commonly known as:  K-DUR,KLOR-CON Take 1 tablet (20 mEq total) by mouth daily.   potassium chloride SA 20 MEQ tablet Commonly known as:  K-DUR,KLOR-CON TAKE 1 TABLET ONCE DAILY   SALMON OIL-1000 PO Take 3,000 mg by mouth daily.          Objective:    BP 132/88   Pulse 74   Temp 98.5 F (36.9 C) (Oral)   Ht _1  (1.905 m)   Wt (!) 378 lb (171.5 kg)   BMI 47.25 kg/m   Wt Readings from Last 3 Encounters:  03/04/17 (!) 378 lb (171.5 kg)  08/30/16 (!) 362 lb (164.2 kg)  02/29/16 (!) 365 lb 12.8 oz (165.9 kg)    Physical Exam  Constitutional: She is oriented to person, place, and time. She appears well-developed and well-nourished. No distress.  Eyes: Conjunctivae are normal.  Neck: Neck supple. No thyromegaly present.  Cardiovascular: Normal rate, regular rhythm, normal heart sounds and intact distal pulses.  No murmur heard. Pulmonary/Chest: Effort normal and breath sounds normal. No respiratory distress. She has no wheezes. She has no rales.  Musculoskeletal: Normal range of motion. She exhibits no edema (Trace edema, wearing compression stockings currently).  Lymphadenopathy:    She  has no cervical adenopathy.  Neurological: She is alert and oriented to person, place, and time. Coordination normal.  Skin: Skin is warm and dry. No rash noted. She is not diaphoretic.  Psychiatric: She has a normal mood and affect. Her behavior is normal.  Nursing note and vitals reviewed.     Assessment & Plan:   Problem List Items Addressed This Visit      Cardiovascular and Mediastinum   Essential hypertension   Relevant Orders   CMP14+EGFR   Lipid panel     Endocrine   Hypothyroidism - Primary   Relevant Orders   Lipid panel   TSH       Follow up plan: Return in about 6 months (around 09/01/2017), or if symptoms worsen or fail to improve, for Hypertension and thyroid recheck.  Counseling provided for all of the vaccine components Orders Placed This Encounter  Procedures  . CMP14+EGFR  . Lipid panel  . TSH    Caryl Pina, MD [facility] 03/04/2017, 8:04 AM

## 2017-03-05 ENCOUNTER — Encounter: Payer: Self-pay | Admitting: Family Medicine

## 2017-03-05 DIAGNOSIS — I10 Essential (primary) hypertension: Secondary | ICD-10-CM

## 2017-03-05 DIAGNOSIS — I890 Lymphedema, not elsewhere classified: Secondary | ICD-10-CM

## 2017-03-05 DIAGNOSIS — E039 Hypothyroidism, unspecified: Secondary | ICD-10-CM

## 2017-03-05 LAB — CMP14+EGFR
ALBUMIN: 4.4 g/dL (ref 3.6–4.8)
ALT: 26 IU/L (ref 0–32)
AST: 27 IU/L (ref 0–40)
Albumin/Globulin Ratio: 1.5 (ref 1.2–2.2)
Alkaline Phosphatase: 100 IU/L (ref 39–117)
BILIRUBIN TOTAL: 0.4 mg/dL (ref 0.0–1.2)
BUN/Creatinine Ratio: 12 (ref 12–28)
BUN: 9 mg/dL (ref 8–27)
CO2: 25 mmol/L (ref 20–29)
Calcium: 9.6 mg/dL (ref 8.7–10.3)
Chloride: 98 mmol/L (ref 96–106)
Creatinine, Ser: 0.77 mg/dL (ref 0.57–1.00)
GFR, EST AFRICAN AMERICAN: 97 mL/min/{1.73_m2} (ref >59)
GFR, EST NON AFRICAN AMERICAN: 84 mL/min/{1.73_m2} (ref >59)
GLUCOSE: 98 mg/dL (ref 65–99)
Globulin, Total: 2.9 g/dL (ref 1.5–4.5)
Potassium: 3.8 mmol/L (ref 3.5–5.2)
SODIUM: 143 mmol/L (ref 134–144)
Total Protein: 7.3 g/dL (ref 6.0–8.5)

## 2017-03-05 LAB — LIPID PANEL
CHOL/HDL RATIO: 4.6 ratio — AB (ref 0.0–4.4)
Cholesterol, Total: 215 mg/dL — ABNORMAL HIGH (ref 100–199)
HDL: 47 mg/dL (ref >39)
LDL CALC: 137 mg/dL — AB (ref 0–99)
Triglycerides: 154 mg/dL — ABNORMAL HIGH (ref 0–149)
VLDL Cholesterol Cal: 31 mg/dL (ref 5–40)

## 2017-03-05 LAB — TSH: TSH: 2.45 u[IU]/mL (ref 0.450–4.500)

## 2017-03-06 MED ORDER — LEVOTHYROXINE SODIUM 200 MCG PO TABS: 200.0000 ug | ORAL_TABLET | Freq: Every day | ORAL | 5 refills | Status: DC

## 2017-03-06 MED ORDER — POTASSIUM CHLORIDE CRYS ER 20 MEQ PO TBCR: 20.0000 meq | EXTENDED_RELEASE_TABLET | Freq: Every day | ORAL | 5 refills | Status: DC

## 2017-03-06 MED ORDER — FUROSEMIDE 20 MG PO TABS: 20.0000 mg | ORAL_TABLET | ORAL | 5 refills | Status: DC | PRN

## 2017-03-06 MED ORDER — HYDROCHLOROTHIAZIDE 12.5 MG PO TABS: 12.5000 mg | ORAL_TABLET | Freq: Every day | ORAL | 1 refills | Status: DC

## 2017-03-20 ENCOUNTER — Encounter: Payer: Self-pay | Admitting: Family Medicine

## 2017-03-20 MED ORDER — BENZONATATE 200 MG PO CAPS: 200.0000 mg | ORAL_CAPSULE | Freq: Two times a day (BID) | ORAL | 0 refills | Status: DC | PRN

## 2017-03-20 NOTE — Telephone Encounter (Signed)
Please let patient know that I sent in Tessalon Perles to help with the cough, if she continues to worsen then she would likely need to come in to be seen and evaluated Arville CareJoshua Dettinger, MD [facility] 03/20/2017, 1:27 PM

## 2017-05-02 ENCOUNTER — Ambulatory Visit (INDEPENDENT_AMBULATORY_CARE_PROVIDER_SITE_OTHER): Payer: Medicaid Other | Admitting: Vascular Surgery

## 2017-05-02 ENCOUNTER — Encounter (INDEPENDENT_AMBULATORY_CARE_PROVIDER_SITE_OTHER): Payer: Self-pay | Admitting: Vascular Surgery

## 2017-05-02 VITALS — BP 158/88 | HR 81 | Resp 18 | Ht 75.5 in | Wt 374.0 lb

## 2017-05-02 DIAGNOSIS — I890 Lymphedema, not elsewhere classified: Secondary | ICD-10-CM

## 2017-05-02 DIAGNOSIS — E039 Hypothyroidism, unspecified: Secondary | ICD-10-CM | POA: Diagnosis not present

## 2017-05-02 DIAGNOSIS — I10 Essential (primary) hypertension: Secondary | ICD-10-CM

## 2017-05-02 NOTE — Progress Notes (Signed)
MRN : [id number]  Leone PayorLeslie Koch is a 62 y.o. (October 28, 1955) female who presents with chief complaint of  Chief Complaint  Patient presents with  . Follow-up    1 year no studies  .  History of Present Illness:   The patient returns to the office for followup evaluation regarding leg swelling.  The swelling has persisted but with the lymph pump the patient states the swelling is much better controlled. The pain associated with swelling is essentially eliminated. There have not been any interval development of a ulcerations or wounds.  No episodes of cellulitis or infection over the past 12 months  The patient denies problems with the pump, noting it is working well and the leggings are in good condition.  Since the previous visit the patient has been wearing graduated compression stockings and using the lymph pump on a routine basis and  has noted significant improvement in the lymphedema.   Patient stated the lymph pump has been a very positive factor in her care.    Current Meds  Medication Sig  . Coenzyme Q10 (COQ10) 400 MG CAPS Take 400 mg by mouth.  . furosemide (LASIX) 20 MG tablet Take 1 tablet (20 mg total) by mouth as needed.  . hydrochlorothiazide (HYDRODIURIL) 12.5 MG tablet Take 1 tablet (12.5 mg total) by mouth daily.  Marland Kitchen. ibuprofen (ADVIL,MOTRIN) 600 MG tablet Take 600 mg by mouth.  . levothyroxine (SYNTHROID, LEVOTHROID) 200 MCG tablet Take 1 tablet (200 mcg total) by mouth daily.  . Melatonin 5 MG CAPS Take 5 mg by mouth at bedtime.  . Omega-3 Fatty Acids (SALMON OIL-1000 PO) Take 3,000 mg by mouth daily.  . potassium chloride SA (K-DUR,KLOR-CON) 20 MEQ tablet Take 1 tablet (20 mEq total) by mouth daily.    Past Medical History:  Diagnosis Date  . Hyperlipidemia   . Hypertension   . Hypothyroidism   . Lymphedema   . Neuropathy   . Venous stasis     Past Surgical History:  Procedure Laterality Date  . CHOLECYSTECTOMY  1989  . LEG SURGERY Left    broken  left leg    Social History Social History   Tobacco Use  . Smoking status: Former Smoker    Packs/day: 3.00    Years: 40.00    Pack years: 120.00    Types: Cigarettes    Start date: 12/04/1972    Last attempt to quit: 01/03/2013    Years since quitting: 4.3  . Smokeless tobacco: Never Used  Substance Use Topics  . Alcohol use: No    Alcohol/week: 0.0 oz  . Drug use: No    Family History Family History  Problem Relation Age of Onset  . Thyroid disease Mother   . Bone cancer Mother   . Thyroid disease Father   . Emphysema Father   . Diabetes Father   . Neuropathy Father   . Diabetes Brother   . Thyroid disease Brother   . Thyroid disease Sister     Allergies  Allergen Reactions  . Demerol [Meperidine] Other (See Comments)    Stops my heart  . Amoxicillin Hives  . Red Yeast Rice [Cholestin] Itching  . Statins Other (See Comments)    sob     REVIEW OF SYSTEMS (Negative unless checked)  Constitutional: [] Weight loss  [] Fever  [] Chills Cardiac: [] Chest pain   [] Chest pressure   [] Palpitations   [] Shortness of breath when laying flat   [] Shortness of breath with exertion. Vascular:  [] Pain  in legs with walking   [] Pain in legs at rest  [] History of DVT   [] Phlebitis   [x] Swelling in legs   [x] Varicose veins   [] Non-healing ulcers Pulmonary:   [] Uses home oxygen   [] Productive cough   [] Hemoptysis   [] Wheeze  [] COPD   [] Asthma Neurologic:  [] Dizziness   [] Seizures   [] History of stroke   [] History of TIA  [] Aphasia   [] Vissual changes   [] Weakness or numbness in arm   [] Weakness or numbness in leg Musculoskeletal:   [] Joint swelling   [x] Joint pain   [x] Low back pain Hematologic:  [] Easy bruising  [] Easy bleeding   [] Hypercoagulable state   [] Anemic Gastrointestinal:  [] Diarrhea   [] Vomiting  [] Gastroesophageal reflux/heartburn   [] Difficulty swallowing. Genitourinary:  [] Chronic kidney disease   [] Difficult urination  [] Frequent urination   [] Blood in urine Skin:   [] Rashes   [] Ulcers  Psychological:  [] History of anxiety   []  History of major depression.  Physical Examination  Vitals:   05/02/17 0959  BP: (!) 158/88  Pulse: 81  Resp: 18  Weight: (!) 374 lb (169.6 kg)  Height: 6' 3.5" (1.918 m)   Body mass index is 46.13 kg/m. Gen: WD/WN, NAD Head: /AT, No temporalis wasting.  Ear/Nose/Throat: Hearing grossly intact, nares w/o erythema or drainage Eyes: PER, EOMI, sclera nonicteric.  Neck: Supple, no large masses.   Pulmonary:  Good air movement, no audible wheezing bilaterally, no use of accessory muscles.  Cardiac: RRR, no JVD Vascular: scattered varicosities present bilaterally.  Severe venous stasis changes to the legs bilaterally.  3-4+ soft pitting edema Vessel Right Left  Radial Palpable Palpable  PT Palpable Palpable  DP Palpable Palpable  Gastrointestinal: Non-distended. No guarding/no peritoneal signs.  Musculoskeletal: M/S 5/5 throughout.  No deformity or atrophy.  Neurologic: CN 2-12 intact. Symmetrical.  Speech is fluent. Motor exam as listed above. Psychiatric: Judgment intact, Mood & affect appropriate for pt's clinical situation. Dermatologic: venous rashes no ulcers noted.  No changes consistent with cellulitis. Lymph : No lichenification or skin changes of chronic lymphedema.  CBC No results found for: WBC, HGB, HCT, MCV, PLT  BMET    Component Value Date/Time   NA 143 03/04/2017 0825   K 3.8 03/04/2017 0825   CL 98 03/04/2017 0825   CO2 25 03/04/2017 0825   GLUCOSE 98 03/04/2017 0825   BUN 9 03/04/2017 0825   CREATININE 0.77 03/04/2017 0825   CALCIUM 9.6 03/04/2017 0825   GFRNONAA 84 03/04/2017 0825   GFRAA 97 03/04/2017 0825   CrCl cannot be calculated (Patient's most recent lab result is older than the maximum 21 days allowed.).  COAG No results found for: INR, PROTIME  Radiology No results found.    Assessment/Plan 1. Lymph edema  No surgery or intervention at this point in time.    I have  reviewed my discussion with the patient regarding lymphedema and why it  causes symptoms.  Patient will continue wearing graduated compression stockings class 1 (20-30 mmHg) on a daily basis a prescription was given. The patient is reminded to put the stockings on first thing in the morning and removing them in the evening. The patient is instructed specifically not to sleep in the stockings.   In addition, behavioral modification throughout the day will be continued.  This will include frequent elevation (such as in a recliner), use of over the counter pain medications as needed and exercise such as walking.  I have reviewed systemic causes for chronic edema  such as liver, kidney and cardiac etiologies and there does not appear to be any significant changes in these organ systems over the past year.  The patient is under the impression that these organ systems are all stable and unchanged.    The patient will continue aggressive use of the  lymph pump.  This will continue to improve the edema control and prevent sequela such as ulcers and infections.   The patient will follow-up with me on an annual basis.    2. Essential hypertension Continue antihypertensive medications as already ordered, these medications have been reviewed and there are no changes at this time.   3. Hypothyroidism, unspecified type Continue Synthroid as ordered and reviewed, no changes at this time     Levora Dredge, MD  05/02/2017 9:01 PM

## 2017-05-03 ENCOUNTER — Encounter: Payer: Self-pay | Admitting: Family Medicine

## 2017-06-26 ENCOUNTER — Encounter: Payer: Self-pay | Admitting: Family Medicine

## 2017-06-26 ENCOUNTER — Ambulatory Visit: Payer: Medicaid Other | Admitting: Family Medicine

## 2017-06-26 VITALS — BP 129/87 | HR 75 | Temp 97.5°F | Ht 75.0 in | Wt 374.0 lb

## 2017-06-26 DIAGNOSIS — G629 Polyneuropathy, unspecified: Secondary | ICD-10-CM | POA: Diagnosis not present

## 2017-06-26 DIAGNOSIS — I10 Essential (primary) hypertension: Secondary | ICD-10-CM

## 2017-06-26 DIAGNOSIS — E039 Hypothyroidism, unspecified: Secondary | ICD-10-CM | POA: Diagnosis not present

## 2017-06-26 DIAGNOSIS — I890 Lymphedema, not elsewhere classified: Secondary | ICD-10-CM | POA: Diagnosis not present

## 2017-06-26 MED ORDER — DULOXETINE HCL 30 MG PO CPEP: 30.0000 mg | ORAL_CAPSULE | Freq: Every day | ORAL | 3 refills | Status: DC

## 2017-06-26 NOTE — Progress Notes (Signed)
BP 129/87   Pulse 75   Temp (!) 97.5 F (36.4 C) (Oral)   Ht 6' 3"  (1.905 m)   Wt (!) 374 lb (169.6 kg)   BMI 46.75 kg/m    Subjective:    Patient ID: Dana Koch, female    DOB: 19-Oct-1955, 63 y.o.   MRN: [id number]  HPI: Dana Koch is a 62 y.o. female presenting on 06/26/2017 for Medical Management of Chronic Issues   HPI Hypothyroidism recheck Patient is coming in for thyroid recheck today as well. They deny any issues with hair changes or heat or cold problems or diarrhea or constipation. They deny any chest pain or palpitations. They are currently on levothyroxine 256mcrograms   Hypertension Patient is currently on hydrochlorothiazide, and their blood pressure today is 129/87. Patient denies any lightheadedness or dizziness. Patient denies headaches, blurred vision, chest pains, shortness of breath, or weakness. Denies any side effects from medication and is content with current medication.   Lymphedema and peripheral neuropathy Patient is coming in for recheck of lymphedema and polyneuropathy.  She says her nerve pain has not been doing well recently and she would like to consider changes.  She has tried things previously but not had a lot of success.  She does need a refill on her Lasix which does help with the fluid and she has been using it most of the time as needed but has been using a little more frequently recently.  She says the neuropathy is a burning and tingling sensation in both of her lower extremities and has some in her hands as well.  Neurology previously.  Relevant past medical, surgical, family and social history reviewed and updated as indicated. Interim medical history since our last visit reviewed. Allergies and medications reviewed and updated.  Review of Systems  Constitutional: Negative for chills and fever.  Eyes: Negative for redness and visual disturbance.  Respiratory: Negative for chest tightness and shortness of breath.   Cardiovascular:  Positive for leg swelling. Negative for chest pain and palpitations.  Gastrointestinal: Negative for abdominal pain.  Musculoskeletal: Negative for back pain and gait problem.  Skin: Negative for rash.  Neurological: Positive for numbness. Negative for weakness, light-headedness and headaches.  Psychiatric/Behavioral: Negative for agitation and behavioral problems.  All other systems reviewed and are negative.   Per HPI unless specifically indicated above   Allergies as of 06/26/2017      Reactions   Demerol [meperidine] Other (See Comments)   Stops my heart   Amoxicillin Hives   Red Yeast Rice [cholestin] Itching   Statins Other (See Comments)   sob      Medication List        Accurate as of 06/26/17  8:54 AM. Always use your most recent med list.          CoQ10 400 MG Caps Take 400 mg by mouth.   furosemide 20 MG tablet Commonly known as:  LASIX Take 1 tablet (20 mg total) by mouth as needed.   hydrochlorothiazide 12.5 MG tablet Commonly known as:  HYDRODIURIL Take 1 tablet (12.5 mg total) by mouth daily.   ibuprofen 600 MG tablet Commonly known as:  ADVIL,MOTRIN Take 600 mg by mouth.   levothyroxine 200 MCG tablet Commonly known as:  SYNTHROID, LEVOTHROID Take 1 tablet (200 mcg total) by mouth daily.   Melatonin 5 MG Caps Take 5 mg by mouth at bedtime.   potassium chloride SA 20 MEQ tablet Commonly known as:  K-DUR,KLOR-CON Take 1  tablet (20 mEq total) by mouth daily.   SALMON OIL-1000 PO Take 3,000 mg by mouth daily.          Objective:    BP 129/87   Pulse 75   Temp (!) 97.5 F (36.4 C) (Oral)   Ht 6' 3"  (1.905 m)   Wt (!) 374 lb (169.6 kg)   BMI 46.75 kg/m   Wt Readings from Last 3 Encounters:  06/26/17 (!) 374 lb (169.6 kg)  05/02/17 (!) 374 lb (169.6 kg)  03/04/17 (!) 378 lb (171.5 kg)    Physical Exam  Constitutional: She is oriented to person, place, and time. She appears well-developed and well-nourished. No distress.  Eyes:  Conjunctivae are normal.  Neck: Neck supple. No thyromegaly present.  Cardiovascular: Normal rate, regular rhythm, normal heart sounds and intact distal pulses.  No murmur heard. Pulmonary/Chest: Effort normal and breath sounds normal. No respiratory distress. She has no wheezes.  Musculoskeletal: Normal range of motion. She exhibits no edema or tenderness (No tenderness to palpation).  Lymphadenopathy:    She has no cervical adenopathy.  Neurological: She is alert and oriented to person, place, and time. She displays normal reflexes. No cranial nerve deficit or sensory deficit (No sensory deficit on exam). She exhibits normal muscle tone. Coordination normal.  Skin: Skin is warm and dry. No rash noted. She is not diaphoretic.  Psychiatric: She has a normal mood and affect. Her behavior is normal.  Nursing note and vitals reviewed.       Assessment & Plan:   Problem List Items Addressed This Visit      Cardiovascular and Mediastinum   Essential hypertension   Relevant Orders   BMP8+EGFR (Completed)     Endocrine   Hypothyroidism - Primary   Relevant Orders   TSH (Completed)     Other   Lymph edema   Relevant Medications   DULoxetine (CYMBALTA) 30 MG capsule   Other Relevant Orders   BMP8+EGFR (Completed)   Morbid obesity ([place])    Other Visit Diagnoses    Peripheral polyneuropathy       Relevant Medications   DULoxetine (CYMBALTA) 30 MG capsule    Patient had increased her Lasix because her lymphedema clinic physician told her to to help with the fluid regulation.  She is here to recheck her renal function.  She does admit that she had some flank pain but that improved when she started drinking water, this was a couple weeks ago    Follow up plan: Return in about 6 months (around 12/26/2017), or if symptoms worsen or fail to improve, for Recheck thyroid.  Counseling provided for all of the vaccine components Orders Placed This Encounter  Procedures  . TSH  .  BMP8+EGFR    Caryl Pina, MD [facility] 06/26/2017, 8:54 AM

## 2017-06-27 ENCOUNTER — Encounter: Payer: Self-pay | Admitting: Family Medicine

## 2017-06-27 LAB — BMP8+EGFR
BUN/Creatinine Ratio: 15 (ref 12–28)
BUN: 11 mg/dL (ref 8–27)
CALCIUM: 9.6 mg/dL (ref 8.7–10.3)
CO2: 23 mmol/L (ref 20–29)
CREATININE: 0.75 mg/dL (ref 0.57–1.00)
Chloride: 99 mmol/L (ref 96–106)
GFR, EST AFRICAN AMERICAN: 100 mL/min/{1.73_m2} (ref >59)
GFR, EST NON AFRICAN AMERICAN: 87 mL/min/{1.73_m2} (ref >59)
Glucose: 107 mg/dL — ABNORMAL HIGH (ref 65–99)
POTASSIUM: 4 mmol/L (ref 3.5–5.2)
SODIUM: 142 mmol/L (ref 134–144)

## 2017-06-27 LAB — TSH: TSH: 2.62 u[IU]/mL (ref 0.450–4.500)

## 2017-07-01 ENCOUNTER — Encounter: Payer: Self-pay | Admitting: Family Medicine

## 2017-08-25 ENCOUNTER — Encounter: Payer: Self-pay | Admitting: Family Medicine

## 2017-08-29 ENCOUNTER — Ambulatory Visit: Payer: Medicaid Other | Admitting: Family Medicine

## 2017-09-03 ENCOUNTER — Ambulatory Visit: Payer: Medicaid Other | Admitting: Family Medicine

## 2017-09-11 ENCOUNTER — Telehealth: Payer: Self-pay | Admitting: Family Medicine

## 2017-09-11 NOTE — Telephone Encounter (Signed)
Apt made with Dr. Algis Downs

## 2017-09-19 ENCOUNTER — Encounter: Payer: Self-pay | Admitting: Family Medicine

## 2017-09-19 ENCOUNTER — Ambulatory Visit: Payer: Medicaid Other | Admitting: Family Medicine

## 2017-09-19 VITALS — BP 139/76 | HR 77 | Temp 97.2°F | Ht 75.0 in | Wt 362.8 lb

## 2017-09-19 DIAGNOSIS — I10 Essential (primary) hypertension: Secondary | ICD-10-CM

## 2017-09-19 DIAGNOSIS — G629 Polyneuropathy, unspecified: Secondary | ICD-10-CM

## 2017-09-19 DIAGNOSIS — L723 Sebaceous cyst: Secondary | ICD-10-CM

## 2017-09-19 DIAGNOSIS — E785 Hyperlipidemia, unspecified: Secondary | ICD-10-CM | POA: Diagnosis not present

## 2017-09-19 DIAGNOSIS — E039 Hypothyroidism, unspecified: Secondary | ICD-10-CM | POA: Diagnosis not present

## 2017-09-19 MED ORDER — DULOXETINE HCL 30 MG PO CPEP: 30.0000 mg | ORAL_CAPSULE | Freq: Two times a day (BID) | ORAL | 3 refills | Status: DC

## 2017-09-19 NOTE — Progress Notes (Signed)
BP 139/76   Pulse 77   Temp (!) 97.2 F (36.2 C) (Oral)   Ht _0  (1.905 m)   Wt (!) 362 lb 12.8 oz (164.6 kg)   BMI 45.35 kg/m    Subjective:    Patient ID: Dana Koch, female    DOB: [birth year], 62 y.o.   MRN: [id number]  HPI: Dana Koch is a 62 y.o. female presenting on 09/19/2017 for knot on back (Patient states it is below her bra strap on spine. Patient states it has been there for years but now had a odor to it since scratching it.)   HPI KNot on back Patient has a knot on her middle back near where her bra strap is that she is had for quite some time, she thinks it has been many many years that she is had it but just recently she is noticed that she has had some odor coming from it and had a little scab over top of it.  She denies any pain or noticed any drainage with it.  She denies any fevers or chills or shortness of breath or wheezing.  Leg pain and burning and numbness Patient is coming in for recheck of her leg pain and burning and numbness and neuropathy for which she has been on Cymbalta and she says that it is just not cutting it completely.  She is currently on the low dose of 30 mg.  She says the burning is mostly in her feet but she does get some every now and then up into her legs  Patient is also coming in for recheck of her labs and wants to get them done for hypertension and thyroid and cholesterol.  She says she does not have any issues with that medication or those problems and just wants to recheck her labs today.  Relevant past medical, surgical, family and social history reviewed and updated as indicated. Interim medical history since our last visit reviewed. Allergies and medications reviewed and updated.  Review of Systems  Constitutional: Negative for chills and fever.  Eyes: Negative for visual disturbance.  Respiratory: Negative for chest tightness and shortness of breath.   Cardiovascular: Negative for chest pain and leg swelling.    Musculoskeletal: Negative for back pain and gait problem.  Skin: Positive for wound. Negative for rash.  Neurological: Positive for numbness. Negative for weakness, light-headedness and headaches.  Psychiatric/Behavioral: Negative for agitation, behavioral problems, dysphoric mood, self-injury, sleep disturbance and suicidal ideas. The patient is not nervous/anxious.   All other systems reviewed and are negative.   Per HPI unless specifically indicated above   Allergies as of 09/19/2017      Reactions   Demerol [meperidine] Other (See Comments)   Stops my heart   Amoxicillin Hives   Red Yeast Rice [cholestin] Itching   Statins Other (See Comments)   sob      Medication List        Accurate as of 09/19/17  8:10 AM. Always use your most recent med list.          CoQ10 400 MG Caps Take 400 mg by mouth.   DULoxetine 30 MG capsule Commonly known as:  CYMBALTA Take 1 capsule (30 mg total) by mouth 2 (two) times daily.   furosemide 20 MG tablet Commonly known as:  LASIX Take 1 tablet (20 mg total) by mouth as needed.   hydrochlorothiazide 12.5 MG tablet Commonly known as:  HYDRODIURIL Take 1 tablet (12.5 mg total) by mouth  daily.   levothyroxine 200 MCG tablet Commonly known as:  SYNTHROID, LEVOTHROID Take 1 tablet (200 mcg total) by mouth daily.   Melatonin 5 MG Caps Take 5 mg by mouth at bedtime.   naproxen sodium 220 MG tablet Commonly known as:  ALEVE Take 220 mg by mouth.   potassium chloride SA 20 MEQ tablet Commonly known as:  K-DUR,KLOR-CON Take 1 tablet (20 mEq total) by mouth daily.   SALMON OIL-1000 PO Take 3,000 mg by mouth daily.          Objective:    BP 139/76   Pulse 77   Temp (!) 97.2 F (36.2 C) (Oral)   Ht _0  (1.905 m)   Wt (!) 362 lb 12.8 oz (164.6 kg)   BMI 45.35 kg/m   Wt Readings from Last 3 Encounters:  09/19/17 (!) 362 lb 12.8 oz (164.6 kg)  06/26/17 (!) 374 lb (169.6 kg)  05/02/17 (!) 374 lb (169.6 kg)    Physical  Exam  Constitutional: She is oriented to person, place, and time. She appears well-developed and well-nourished. No distress.  Eyes: Conjunctivae are normal.  Cardiovascular: Normal rate, regular rhythm, normal heart sounds and intact distal pulses.  No murmur heard. Pulmonary/Chest: Effort normal and breath sounds normal. No respiratory distress. She has no wheezes.  Musculoskeletal: She exhibits edema (Trace).  Neurological: She is alert and oriented to person, place, and time. Coordination normal.  Skin: Skin is warm and dry. Lesion (Very small sebaceous cyst on middle back near her bra line.  Unable to express anything from it at this point, no erythema or warmth) noted. No rash noted. She is not diaphoretic. No erythema.  Psychiatric: She has a normal mood and affect. Her behavior is normal.  Nursing note and vitals reviewed.       Assessment & Plan:   Problem List Items Addressed This Visit      Cardiovascular and Mediastinum   Essential hypertension   Relevant Orders   CMP14+EGFR   CBC with Differential/Platelet     Endocrine   Hypothyroidism   Relevant Orders   TSH    Other Visit Diagnoses    Sebaceous cyst    -  Primary   Relevant Orders   CBC with Differential/Platelet   Peripheral polyneuropathy       Relevant Medications   DULoxetine (CYMBALTA) 30 MG capsule   Hyperlipidemia LDL goal <130       Relevant Orders   Lipid panel      We increase the Cymbalta to 60 mg to see if I can help her more.  Otherwise continue current medications and check labs.  Nothing needs to be done with a sebaceous cyst and patient is agreeable towards this plan.  She will call back if she develops any kinds of infections. Follow up plan: Return in about 6 months (around 03/20/2018), or if symptoms worsen or fail to improve, for Recheck thyroid and hypertension cholesterol.  Counseling provided for all of the vaccine components Orders Placed This Encounter  Procedures  . CMP14+EGFR   . CBC with Differential/Platelet  . Lipid panel  . TSH    Caryl Pina, MD [facility] 09/19/2017, 8:10 AM

## 2017-09-20 LAB — CMP14+EGFR
ALBUMIN: 4.5 g/dL (ref 3.6–4.8)
ALT: 22 IU/L (ref 0–32)
AST: 20 IU/L (ref 0–40)
Albumin/Globulin Ratio: 1.7 (ref 1.2–2.2)
Alkaline Phosphatase: 88 IU/L (ref 39–117)
BUN/Creatinine Ratio: 16 (ref 12–28)
BUN: 12 mg/dL (ref 8–27)
Bilirubin Total: 0.4 mg/dL (ref 0.0–1.2)
CALCIUM: 9.4 mg/dL (ref 8.7–10.3)
CO2: 24 mmol/L (ref 20–29)
CREATININE: 0.75 mg/dL (ref 0.57–1.00)
Chloride: 99 mmol/L (ref 96–106)
GFR, EST AFRICAN AMERICAN: 100 mL/min/{1.73_m2} (ref >59)
GFR, EST NON AFRICAN AMERICAN: 87 mL/min/{1.73_m2} (ref >59)
GLUCOSE: 102 mg/dL — AB (ref 65–99)
Globulin, Total: 2.7 g/dL (ref 1.5–4.5)
Potassium: 4.2 mmol/L (ref 3.5–5.2)
Sodium: 141 mmol/L (ref 134–144)
TOTAL PROTEIN: 7.2 g/dL (ref 6.0–8.5)

## 2017-09-20 LAB — CBC WITH DIFFERENTIAL/PLATELET
BASOS: 1 %
Basophils Absolute: 0 10*3/uL (ref 0.0–0.2)
EOS (ABSOLUTE): 0.1 10*3/uL (ref 0.0–0.4)
EOS: 3 %
HEMATOCRIT: 38.4 % (ref 34.0–46.6)
HEMOGLOBIN: 12.7 g/dL (ref 11.1–15.9)
Immature Grans (Abs): 0 10*3/uL (ref 0.0–0.1)
Immature Granulocytes: 1 %
Lymphocytes Absolute: 1.7 10*3/uL (ref 0.7–3.1)
Lymphs: 36 %
MCH: 29.1 pg (ref 26.6–33.0)
MCHC: 33.1 g/dL (ref 31.5–35.7)
MCV: 88 fL (ref 79–97)
MONOCYTES: 8 %
Monocytes Absolute: 0.4 10*3/uL (ref 0.1–0.9)
Neutrophils Absolute: 2.4 10*3/uL (ref 1.4–7.0)
Neutrophils: 51 %
Platelets: 254 10*3/uL (ref 150–450)
RBC: 4.37 x10E6/uL (ref 3.77–5.28)
RDW: 13.3 % (ref 12.3–15.4)
WBC: 4.7 10*3/uL (ref 3.4–10.8)

## 2017-09-20 LAB — LIPID PANEL
CHOL/HDL RATIO: 4.7 ratio — AB (ref 0.0–4.4)
Cholesterol, Total: 197 mg/dL (ref 100–199)
HDL: 42 mg/dL (ref >39)
LDL CALC: 119 mg/dL — AB (ref 0–99)
Triglycerides: 182 mg/dL — ABNORMAL HIGH (ref 0–149)
VLDL Cholesterol Cal: 36 mg/dL (ref 5–40)

## 2017-09-20 LAB — TSH: TSH: 1.06 u[IU]/mL (ref 0.450–4.500)

## 2017-09-24 ENCOUNTER — Encounter: Payer: Self-pay | Admitting: Family Medicine

## 2017-10-01 ENCOUNTER — Other Ambulatory Visit: Payer: Self-pay | Admitting: Family Medicine

## 2017-11-11 DIAGNOSIS — Z23 Encounter for immunization: Secondary | ICD-10-CM | POA: Diagnosis not present

## 2017-12-02 ENCOUNTER — Encounter: Payer: Self-pay | Admitting: Family Medicine

## 2017-12-26 ENCOUNTER — Ambulatory Visit: Payer: Medicaid Other | Admitting: Family Medicine

## 2018-01-10 ENCOUNTER — Other Ambulatory Visit: Payer: Self-pay | Admitting: Family Medicine

## 2018-01-10 DIAGNOSIS — G629 Polyneuropathy, unspecified: Secondary | ICD-10-CM

## 2018-01-12 ENCOUNTER — Encounter: Payer: Self-pay | Admitting: Family Medicine

## 2018-01-13 ENCOUNTER — Encounter: Payer: Self-pay | Admitting: Family Medicine

## 2018-01-15 HISTORY — PX: TOTAL ABDOMINAL HYSTERECTOMY: SHX209

## 2018-01-24 ENCOUNTER — Encounter: Payer: Self-pay | Admitting: Family Medicine

## 2018-01-27 ENCOUNTER — Telehealth: Payer: Self-pay | Admitting: Family Medicine

## 2018-01-27 ENCOUNTER — Encounter: Payer: Self-pay | Admitting: *Deleted

## 2018-01-27 NOTE — Telephone Encounter (Signed)
Aware.  Please ask provider to add hep C   to next blood work.

## 2018-02-05 ENCOUNTER — Encounter: Payer: Self-pay | Admitting: Adult Health

## 2018-02-05 ENCOUNTER — Ambulatory Visit: Payer: Medicaid Other | Admitting: Adult Health

## 2018-02-05 ENCOUNTER — Other Ambulatory Visit (HOSPITAL_COMMUNITY)
Admission: RE | Admit: 2018-02-05 | Discharge: 2018-02-05 | Disposition: A | Discharge status: Home / Self Care | Payer: Medicaid Other | Source: Ambulatory Visit | Attending: Adult Health | Admitting: Adult Health

## 2018-02-05 VITALS — BP 144/89 | HR 83 | Ht 73.0 in | Wt 357.8 lb

## 2018-02-05 DIAGNOSIS — Z124 Encounter for screening for malignant neoplasm of cervix: Secondary | ICD-10-CM | POA: Insufficient documentation

## 2018-02-05 DIAGNOSIS — Z0000 Encounter for general adult medical examination without abnormal findings: Secondary | ICD-10-CM

## 2018-02-05 DIAGNOSIS — Z1212 Encounter for screening for malignant neoplasm of rectum: Secondary | ICD-10-CM

## 2018-02-05 DIAGNOSIS — Z1211 Encounter for screening for malignant neoplasm of colon: Secondary | ICD-10-CM | POA: Diagnosis not present

## 2018-02-05 DIAGNOSIS — Z7689 Persons encountering health services in other specified circumstances: Secondary | ICD-10-CM | POA: Diagnosis not present

## 2018-02-05 DIAGNOSIS — Z113 Encounter for screening for infections with a predominantly sexual mode of transmission: Secondary | ICD-10-CM | POA: Diagnosis not present

## 2018-02-05 DIAGNOSIS — Z01419 Encounter for gynecological examination (general) (routine) without abnormal findings: Secondary | ICD-10-CM

## 2018-02-05 LAB — HEMOCCULT GUIAC POC 1CARD (OFFICE): Fecal Occult Blood, POC: NEGATIVE

## 2018-02-05 NOTE — Progress Notes (Signed)
Patient ID: Dana Koch, female   DOB: 08/13/1955, 63 y.o.   MRN: [id number] History of Present Illness: Dana Koch is a 63 year old white female, separated, in for well woman gyn exam and f pap in over 28 + years. She is a disabled Paediatric nurse. PCP is Dr Louanne Skye.    Current Medications, Allergies, Past Medical History, Past Surgical History, Family History and Social History were reviewed in [employer] record.     Review of Systems:  Patient denies any headaches, hearing loss, fatigue, blurred vision, shortness of breath, chest pain, abdominal pain, problems with bowel movements, urination, or intercourse(not currently active). No mood swings.+boredom Can't lose weight.  Has pain in feet from neuropathy, uses cane  Physical Exam:BP (!) 144/89 (BP Location: Left Arm, Patient Position: Sitting, Cuff Size: Large)   Pulse 83   Ht 6\' 1"  (1.854 m)   Wt (!) 357 lb 12.8 oz (162.3 kg)   BMI 47.21 kg/m  General:  Well developed, well nourished, no acute distress Skin:  Warm and dry Neck:  Midline trachea, normal thyroid, good ROM, no lymphadenopathy,noo carotid bruits heard Lungs; Clear to auscultation bilaterally Breast:  No dominant palpable mass, retraction, or nipple discharge Cardiovascular: Regular rate and rhythm Abdomen:  Soft, non tender, no hepatosplenomegaly Pelvic:  External genitalia is normal in appearance, she does have 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  The vagina is normal in appearance for age, with loss of color, moisture and rugae. Urethra has no lesions or masses. The cervix is smooth, pap with HPV and GC/CHL performed.  Uterus is felt to be normal size, shape, and contour.  No adnexal masses or tenderness noted.Bladder is non tender, no masses felt. Rectal: Good sphincter tone, no polyps, or hemorrhoids felt.  Hemoccult negative. Extremities/musculoskeletal:  No ++ varicosities noted,and  redness in lower legs. no clubbing or cyanosis in fingers Psych:  No mood changes, alert and cooperative,seems happy,but says she is bored a lot, but has female friend that she enjoys. Fall risk is low PHQ 2 score 0. Examination chaperoned by Malachy Mood LPN.  Impression: 1. Encounter for gynecological examination with Papanicolaou smear of cervix   2. Routine cervical smear   3. Screening for colorectal cancer   4. Screening examination for STD (sexually transmitted disease)       Plan: Check HIV and RPR If becomes sexually active use good lubricate  Physical in 1 year Pap in 3 if normal Pt says she will never get mammogram or colonoscopy, will not treat cancer she says Labs with PCP

## 2018-02-06 LAB — HIV ANTIBODY (ROUTINE TESTING W REFLEX): HIV Screen 4th Generation wRfx: NONREACTIVE

## 2018-02-06 LAB — RPR: RPR Ser Ql: NONREACTIVE

## 2018-02-10 ENCOUNTER — Telehealth: Payer: Self-pay | Admitting: Adult Health

## 2018-02-10 ENCOUNTER — Encounter: Payer: Self-pay | Admitting: Adult Health

## 2018-02-10 DIAGNOSIS — R87618 Other abnormal cytological findings on specimens from cervix uteri: Secondary | ICD-10-CM

## 2018-02-10 DIAGNOSIS — R8789 Other abnormal findings in specimens from female genital organs: Secondary | ICD-10-CM

## 2018-02-10 DIAGNOSIS — R87611 Atypical squamous cells cannot exclude high grade squamous intraepithelial lesion on cytologic smear of cervix (ASC-H): Secondary | ICD-10-CM | POA: Insufficient documentation

## 2018-02-10 HISTORY — DX: Other abnormal cytological findings on specimens from cervix uteri: R87.618

## 2018-02-10 LAB — CYTOLOGY - PAP
Chlamydia: NEGATIVE
HPV: DETECTED
Neisseria Gonorrhea: NEGATIVE

## 2018-02-10 NOTE — Telephone Encounter (Signed)
Pt aware that pap was abnormal, with +HPV, negative GC/CHL, will make appt for colpo

## 2018-02-11 ENCOUNTER — Encounter: Payer: Self-pay | Admitting: Family Medicine

## 2018-02-12 ENCOUNTER — Encounter: Payer: Self-pay | Admitting: Family Medicine

## 2018-02-19 ENCOUNTER — Encounter: Payer: Self-pay | Admitting: Obstetrics and Gynecology

## 2018-02-19 ENCOUNTER — Other Ambulatory Visit: Payer: Self-pay | Admitting: Obstetrics and Gynecology

## 2018-02-19 ENCOUNTER — Ambulatory Visit: Payer: Medicaid Other | Admitting: Obstetrics and Gynecology

## 2018-02-19 VITALS — BP 150/86 | HR 102 | Ht 73.0 in | Wt 358.0 lb

## 2018-02-19 DIAGNOSIS — D060 Carcinoma in situ of endocervix: Secondary | ICD-10-CM

## 2018-02-19 DIAGNOSIS — Z7689 Persons encountering health services in other specified circumstances: Secondary | ICD-10-CM | POA: Diagnosis not present

## 2018-02-19 DIAGNOSIS — N72 Inflammatory disease of cervix uteri: Secondary | ICD-10-CM | POA: Diagnosis not present

## 2018-02-19 DIAGNOSIS — R87611 Atypical squamous cells cannot exclude high grade squamous intraepithelial lesion on cytologic smear of cervix (ASC-H): Secondary | ICD-10-CM

## 2018-02-19 NOTE — Addendum Note (Signed)
Addended by: Colen Darling on: 02/19/2018 01:45 PM   Modules accepted: Orders

## 2018-02-19 NOTE — Progress Notes (Addendum)
Patient ID: Dana Koch, female   DOB: 1955-11-02, 63 y.o.   MRN: [id number]  Dana Koch 63 y.o. [id number] here for colposcopy for ASC cannot exclude high grade lesion [facility]) +HRHPV pap smear on 02/05/2018.  Discussed role for HPV in cervical dysplasia, need for surveillance.  Patient given informed consent, signed copy in the chart, time out was performed.  Placed in lithotomy position. Endocervical speculum was used to help spread the cervix.  No lesions seen in the endocervical canal on exam but is technically difficult due to body size cervix viewed with speculum and colposcope after application of acetic acid.  Physical exam: Labia minora bilateral viricosities no thrombosis Vagina well estrogenized  Colposcopy adequate? Yes  no visible dysplasia; biopsies obtained at 12 o'clock at anterior lip and random biopsy at 10 & 2 o'clock on anterior lip Monsel's applied due to bleeding  ECC specimen obtained prior to biopsies All specimens were labelled and sent to pathology.   Colposcopy  IMPRESSION: 1. No visible dysplsia 2. F/u cervical biopsies and ECC 3. Annual PAP: Normal x2   P: Patient was given post procedure instructions. Will follow up pathology and manage accordingly.  Routine preventative health maintenance measures emphasized.    By signing my name below, I, Arnette Norris, attest that this documentation has been prepared under the direction and in the presence of Tilda Burrow, MD. Electronically Signed: Arnette Norris Medical Scribe. 02/19/18. 11:24 AM.  I personally performed the services described in this documentation, which was SCRIBED in my presence. The recorded information has been reviewed and considered accurate. It has been edited as necessary during review. Tilda Burrow, MD

## 2018-02-24 NOTE — Telephone Encounter (Signed)
Pt called: 1. Bleeding has stopped, no further concern. 2. Abnormal results will require a Cold Knife Conization of the cervix. Patient will need a preop appt.

## 2018-02-24 NOTE — Telephone Encounter (Signed)
I called the patient, and asked her to make preop appt.

## 2018-02-25 ENCOUNTER — Encounter: Payer: Self-pay | Admitting: Family Medicine

## 2018-03-01 ENCOUNTER — Other Ambulatory Visit: Payer: Self-pay | Admitting: Family Medicine

## 2018-03-01 DIAGNOSIS — I10 Essential (primary) hypertension: Secondary | ICD-10-CM

## 2018-03-10 ENCOUNTER — Ambulatory Visit: Payer: Medicaid Other | Admitting: Obstetrics and Gynecology

## 2018-03-10 ENCOUNTER — Encounter: Payer: Self-pay | Admitting: Obstetrics and Gynecology

## 2018-03-10 VITALS — BP 132/90 | HR 84 | Ht 73.0 in | Wt 350.4 lb

## 2018-03-10 DIAGNOSIS — R87611 Atypical squamous cells cannot exclude high grade squamous intraepithelial lesion on cytologic smear of cervix (ASC-H): Secondary | ICD-10-CM

## 2018-03-10 DIAGNOSIS — Z7689 Persons encountering health services in other specified circumstances: Secondary | ICD-10-CM | POA: Diagnosis not present

## 2018-03-10 MED ORDER — ESTROGENS, CONJUGATED 0.625 MG/GM VA CREA: 0.5000 g | TOPICAL_CREAM | VAGINAL | 2 refills | Status: DC

## 2018-03-10 NOTE — Patient Instructions (Signed)
Conjugated Estrogens vaginal cream  What is this medicine?  CONJUGATED ESTROGENS (CON ju gate ed ESS troe jenz) are a mixture of female hormones. This cream can help relieve symptoms associated with menopause.like vaginal dryness and irritation.  This medicine may be used for other purposes; ask your health care provider or pharmacist if you have questions.  COMMON BRAND NAME(S): Premarin  What should I tell my health care provider before I take this medicine?  They need to know if you have any of these conditions:  -abnormal vaginal bleeding  -blood vessel disease or blood clots  -breast, cervical, endometrial, or uterine cancer  -dementia  -diabetes  -gallbladder disease  -heart disease or recent heart attack  -high blood pressure  -high cholesterol  -high level of calcium in the blood  -hysterectomy  -kidney disease  -liver disease  -migraine headaches  -protein C deficiency  -protein S deficiency  -stroke  -systemic lupus erythematosus (SLE)  -tobacco smoker  -an unusual or allergic reaction to estrogens other medicines, foods, dyes, or preservatives  -pregnant or trying to get pregnant  -breast-feeding  How should I use this medicine?  This medicine is for use in the vagina only. Do not take by mouth. Follow the directions on the prescription label. Use at bedtime unless otherwise directed by your doctor or health care professional. Use the special applicator supplied with the cream. Wash hands before and after use. Fill the applicator with the cream and remove from the tube. Lie on your back, part and bend your knees. Insert the applicator into the vagina and push the plunger to expel the cream into the vagina. Wash the applicator with warm soapy water and rinse well. Use exactly as directed for the complete length of time prescribed. Do not stop using except on the advice of your doctor or health care professional.  Talk to your pediatrician regarding the use of this medicine in children. Special care may be  needed.  A patient package insert for the product will be given with each prescription and refill. Read this sheet carefully each time. The sheet may change frequently.  Overdosage: If you think you have taken too much of this medicine contact a poison control center or emergency room at once.  NOTE: This medicine is only for you. Do not share this medicine with others.  What if I miss a dose?  If you miss a dose, use it as soon as you can. If it is almost time for your next dose, use only that dose. Do not use double or extra doses.  What may interact with this medicine?  Do not take this medicine with any of the following medications:  -aromatase inhibitors like aminoglutethimide, anastrozole, exemestane, letrozole, testolactone  This medicine may also interact with the following medications:  -barbiturates used for inducing sleep or treating seizures  -carbamazepine  -grapefruit juice  -medicines for fungal infections like itraconazole and ketoconazole  -raloxifene or tamoxifen  -rifabutin  -rifampin  -rifapentine  -ritonavir  -some antibiotics used to treat infections  -[place]'s Wort  -warfarin  This list may not describe all possible interactions. Give your health care provider a list of all the medicines, herbs, non-prescription drugs, or dietary supplements you use. Also tell them if you smoke, drink alcohol, or use illegal drugs. Some items may interact with your medicine.  What should I watch for while using this medicine?  Visit your health care professional for regular checks on your progress. You will need a   regular breast and pelvic exam. You should also discuss the need for regular mammograms with your health care professional, and follow his or her guidelines.  This medicine can make your body retain fluid, making your fingers, hands, or ankles swell. Your blood pressure can go up. Contact your doctor or health care professional if you feel you are retaining fluid.  If you have any reason to think  you are pregnant; stop taking this medicine at once and contact your doctor or health care professional.  Tobacco smoking increases the risk of getting a blood clot or having a stroke, especially if you are more than 62 years old. You are strongly advised not to smoke.  If you wear contact lenses and notice visual changes, or if the lenses begin to feel uncomfortable, consult your eye care specialist.  If you are going to have elective surgery, you may need to stop taking this medicine beforehand. Consult your health care professional for advice prior to scheduling the surgery.  What side effects may I notice from receiving this medicine?  Side effects that you should report to your doctor or health care professional as soon as possible:  -allergic reactions like skin rash, itching or hives, swelling of the face, lips, or tongue  -breast tissue changes or discharge  -changes in vision  -chest pain  -confusion, trouble speaking or understanding  -dark urine  -general ill feeling or flu-like symptoms  -light-colored stools  -nausea, vomiting  -pain, swelling, warmth in the leg  -right upper belly pain  -severe headaches  -shortness of breath  -sudden numbness or weakness of the face, arm or leg  -trouble walking, dizziness, loss of balance or coordination  -unusual vaginal bleeding  -yellowing of the eyes or skin  Side effects that usually do not require medical attention (report to your doctor or health care professional if they continue or are bothersome):  -hair loss  -increased hunger or thirst  -increased urination  -symptoms of vaginal infection like itching, irritation or unusual discharge  -unusually weak or tired  This list may not describe all possible side effects. Call your doctor for medical advice about side effects. You may report side effects to FDA at 1-800-FDA-1088.  Where should I keep my medicine?  Keep out of the reach of children.  Store at room temperature between 15 and 30 degrees C (59 and 86  degrees F). Throw away any unused medicine after the expiration date.  NOTE: This sheet is a summary. It may not cover all possible information. If you have questions about this medicine, talk to your doctor, pharmacist, or health care provider.   2019 Elsevier/Gold Standard (2010-04-05 09:20:36)

## 2018-03-10 NOTE — Progress Notes (Signed)
Patient ID: Dana Koch, female   DOB: [birth year], 63 y.o.   MRN: [id number]    [facility] Clinic Visit  @DATE @            Patient name: Dana Koch MRN [id number]  Date of birth: 1955/04/30  CC & HPI:  Dana Koch is a 63 y.o. female presenting today for discussion of PAP results and methods of treatment.  ROS:  ROS +(ASCH) +HRHPV  All systems are negative except as noted in the HPI and PMH.    Pertinent History Reviewed:   Reviewed:  Medical         Past Medical History:  Diagnosis Date  . Abnormal Papanicolaou smear of cervix 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 02/10/2018   [facility] +HPV, will schedule colpo with Dr Emelda Fear   . Hyperlipidemia   . Hypertension   . Hypothyroidism   . Lymphedema   . Neuropathy   . Venous stasis                               Surgical Hx:    Past Surgical History:  Procedure Laterality Date  . CHOLECYSTECTOMY  1989  . LEG SURGERY Left    broken left leg   Medications: Reviewed & Updated - see associated section                       Current Outpatient Medications:  .  Coenzyme Q10 400 MG CAPS, Take by mouth., Disp: , Rfl:  .  dimethyl sulfoxide 50 % solution, 50 mLs once., Disp: , Rfl:  .  hydrochlorothiazide (MICROZIDE) 12.5 MG capsule, TAKE 1 TABLET DAILY, Disp: 90 capsule, Rfl: 0 .  levothyroxine (SYNTHROID, LEVOTHROID) 200 MCG tablet, Take 1 tablet (200 mcg total) by mouth daily., Disp: 30 tablet, Rfl: 5 .  naproxen sodium (ALEVE) 220 MG tablet, Take 220 mg by mouth., Disp: , Rfl:  .  Omega-3 Fatty Acids (SALMON OIL-1000 PO), Take 3,000 mg by mouth daily., Disp: , Rfl:    Social History: Reviewed -  reports that she quit smoking about 5 years ago. Her smoking use included cigarettes. She started smoking about 45 years ago. She has a 120.00 pack-year smoking history. She has never used smokeless tobacco.  Objective Findings:  Vitals: Blood pressure 132/90, pulse 84, height 6\' 1"  (1.854 m), weight (!) 350 lb 6.4 oz  (158.9 kg).  PHYSICAL EXAMINATION General appearance - alert, well appearing, and in no distress Mental status - alert, oriented to person, place, and time, normal mood, behavior, speech, dress, motor activity, and thought processes, affect appropriate to mood  PELVIC Not done  Assessment & Plan:   A:  1.  (ASCH) +HRHPV   P:   1. 3 month f/u Endocervical curettage and pap 2. Premarin vaginal cream  3 times a week  3. Annual PAP 4. Discussed methods of treatments Cold knife vs endocervical curettage   By signing my name below, I, Arnette Norris, attest that this documentation has been prepared under the direction and in the presence of Tilda Burrow, MD. Electronically Signed: Arnette Norris Medical Scribe. 03/10/18. 2:20 PM.  I personally performed the services described in this documentation, which was SCRIBED in my presence. The recorded information has been reviewed and considered accurate. It has been edited as necessary during review. Tilda Burrow, MD

## 2018-03-20 ENCOUNTER — Ambulatory Visit: Payer: Medicaid Other | Admitting: Family Medicine

## 2018-03-20 ENCOUNTER — Encounter: Payer: Self-pay | Admitting: Family Medicine

## 2018-03-20 VITALS — BP 141/73 | HR 77 | Temp 97.2°F | Ht 73.0 in | Wt 359.4 lb

## 2018-03-20 DIAGNOSIS — Z23 Encounter for immunization: Secondary | ICD-10-CM

## 2018-03-20 DIAGNOSIS — I10 Essential (primary) hypertension: Secondary | ICD-10-CM

## 2018-03-20 DIAGNOSIS — E039 Hypothyroidism, unspecified: Secondary | ICD-10-CM

## 2018-03-20 DIAGNOSIS — Z7689 Persons encountering health services in other specified circumstances: Secondary | ICD-10-CM | POA: Diagnosis not present

## 2018-03-20 DIAGNOSIS — E785 Hyperlipidemia, unspecified: Secondary | ICD-10-CM

## 2018-03-20 NOTE — Addendum Note (Signed)
Addended by: Angela Adam on: 03/20/2018 09:57 AM   Modules accepted: Orders

## 2018-03-20 NOTE — Progress Notes (Signed)
BP (!) 141/73   Pulse 77   Temp (!) 97.2 F (36.2 C) (Oral)   Ht 6' 1"  (1.854 m)   Wt (!) 359 lb 6.4 oz (163 kg)   BMI 47.42 kg/m    Subjective:    Patient ID: Dana Koch, female    DOB: 1955-04-11, 62 y.o.   MRN: [id number]  HPI: Dana Koch is a 63 y.o. female presenting on 03/20/2018 for Hypothyroidism (6 month follow up) and Hypertension   HPI Hypertension Patient is currently on Hydrochlorothiazide, and their blood pressure today is 141/73. Patient denies any lightheadedness or dizziness. Patient denies headaches, blurred vision, chest pains, shortness of breath, or weakness. Denies any side effects from medication and is content with current medication.   Hypothyroidism recheck Patient is coming in for thyroid recheck today as well. They deny any issues with hair changes or heat or cold problems or diarrhea or constipation. They deny any chest pain or palpitations. They are currently on levothyroxine 248mcrograms   Hyperlipidemia Patient is coming in for recheck of his hyperlipidemia. The patient is currently taking fish oils. They deny any issues with myalgias or history of liver damage from it. They deny any focal numbness or weakness or chest pain.   Patient comes in complaining of tinnitus that is worse in the right ear than left, she does know she was in the loud music a lot throughout her life and definitely attributes to that.  She does say that she is having some higher pitched hearing loss but not enough that she wants to go get hearing aids.  She just wants to get it checked and see if anything can be done to help. She denies any pain in the ear she denies any pain in the ear  Relevant past medical, surgical, family and social history reviewed and updated as indicated. Interim medical history since our last visit reviewed. Allergies and medications reviewed and updated.  Review of Systems  Constitutional: Negative for chills and fever.  HENT: Positive for  tinnitus. Negative for congestion, ear discharge, ear pain, sinus pressure, sinus pain and sore throat.   Eyes: Negative for redness and visual disturbance.  Respiratory: Negative for cough, chest tightness, shortness of breath and wheezing.   Cardiovascular: Negative for chest pain and leg swelling.  Genitourinary: Negative for difficulty urinating and dysuria.  Musculoskeletal: Negative for back pain and gait problem.  Skin: Negative for rash.  Neurological: Negative for light-headedness and headaches.  Psychiatric/Behavioral: Negative for agitation and behavioral problems.  All other systems reviewed and are negative.   Per HPI unless specifically indicated above   Allergies as of 03/20/2018      Reactions   Demerol [meperidine] Other (See Comments)   Stops my heart   Amoxicillin Hives   Red Yeast Rice [cholestin] Itching   Statins Other (See Comments)   sob      Medication List       Accurate as of March 20, 2018  8:10 AM. Always use your most recent med list.        Coenzyme Q10 400 MG Caps Take by mouth.   conjugated estrogens vaginal cream Commonly known as:  PREMARIN Place 06.22Applicatorfuls vaginally 3 (three) times a week. For cervixl thinning and irritation   dimethyl sulfoxide 50 % solution 50 mLs once.   hydrochlorothiazide 12.5 MG capsule Commonly known as:  MICROZIDE TAKE 1 TABLET DAILY   levothyroxine 200 MCG tablet Commonly known as:  SYNTHROID, LEVOTHROID Take 1 tablet (  200 mcg total) by mouth daily.   naproxen sodium 220 MG tablet Commonly known as:  ALEVE Take 220 mg by mouth.   SALMON OIL-1000 PO Take 3,000 mg by mouth daily.          Objective:    BP (!) 141/73   Pulse 77   Temp (!) 97.2 F (36.2 C) (Oral)   Ht 6' 1"  (1.854 m)   Wt (!) 359 lb 6.4 oz (163 kg)   BMI 47.42 kg/m   Wt Readings from Last 3 Encounters:  03/20/18 (!) 359 lb 6.4 oz (163 kg)  03/10/18 (!) 350 lb 6.4 oz (158.9 kg)  02/19/18 (!) 358 lb (162.4 kg)      Physical Exam Vitals signs and nursing note reviewed.  Constitutional:      General: She is not in acute distress.    Appearance: She is well-developed. She is not diaphoretic.  Eyes:     Conjunctiva/sclera: Conjunctivae normal.  Cardiovascular:     Rate and Rhythm: Normal rate and regular rhythm.     Heart sounds: Normal heart sounds. No murmur.  Pulmonary:     Effort: Pulmonary effort is normal. No respiratory distress.     Breath sounds: Normal breath sounds. No wheezing.  Musculoskeletal: Normal range of motion.        General: No tenderness.  Skin:    General: Skin is warm and dry.     Findings: No rash.  Neurological:     Mental Status: She is alert and oriented to person, place, and time.     Coordination: Coordination normal.  Psychiatric:        Behavior: Behavior normal.       Assessment & Plan:   Problem List Items Addressed This Visit      Cardiovascular and Mediastinum   Essential hypertension - Primary   Relevant Orders   CMP14+EGFR     Endocrine   Hypothyroidism   Relevant Orders   TSH     Other   Morbid obesity ([place])   Dyslipidemia, goal LDL below 100   Relevant Orders   Lipid panel      Continue levothyroxine and hydrochlorothiazide, will check labs today Follow up plan: Return in about 6 months (around 09/20/2018), or if symptoms worsen or fail to improve, for Hypertension and thyroid.  Counseling provided for all of the vaccine components No orders of the defined types were placed in this encounter.   Caryl Pina, MD [facility] 03/20/2018, 8:10 AM

## 2018-03-21 ENCOUNTER — Other Ambulatory Visit: Payer: Self-pay | Admitting: Family Medicine

## 2018-03-21 DIAGNOSIS — E039 Hypothyroidism, unspecified: Secondary | ICD-10-CM

## 2018-03-21 LAB — LIPID PANEL
Chol/HDL Ratio: 4.6 ratio — ABNORMAL HIGH (ref 0.0–4.4)
Cholesterol, Total: 202 mg/dL — ABNORMAL HIGH (ref 100–199)
HDL: 44 mg/dL (ref >39)
LDL CALC: 119 mg/dL — AB (ref 0–99)
Triglycerides: 194 mg/dL — ABNORMAL HIGH (ref 0–149)
VLDL Cholesterol Cal: 39 mg/dL (ref 5–40)

## 2018-03-21 LAB — CMP14+EGFR
ALT: 18 IU/L (ref 0–32)
AST: 18 IU/L (ref 0–40)
Albumin/Globulin Ratio: 1.7 (ref 1.2–2.2)
Albumin: 4.3 g/dL (ref 3.8–4.8)
Alkaline Phosphatase: 104 IU/L (ref 39–117)
BUN/Creatinine Ratio: 16 (ref 12–28)
BUN: 10 mg/dL (ref 8–27)
Bilirubin Total: 0.3 mg/dL (ref 0.0–1.2)
CO2: 25 mmol/L (ref 20–29)
Calcium: 9.7 mg/dL (ref 8.7–10.3)
Chloride: 101 mmol/L (ref 96–106)
Creatinine, Ser: 0.63 mg/dL (ref 0.57–1.00)
GFR calc Af Amer: 112 mL/min/{1.73_m2} (ref >59)
GFR calc non Af Amer: 97 mL/min/{1.73_m2} (ref >59)
Globulin, Total: 2.5 g/dL (ref 1.5–4.5)
Glucose: 94 mg/dL (ref 65–99)
Potassium: 4.5 mmol/L (ref 3.5–5.2)
Sodium: 143 mmol/L (ref 134–144)
Total Protein: 6.8 g/dL (ref 6.0–8.5)

## 2018-03-21 LAB — TSH: TSH: 0.741 u[IU]/mL (ref 0.450–4.500)

## 2018-05-05 ENCOUNTER — Ambulatory Visit (INDEPENDENT_AMBULATORY_CARE_PROVIDER_SITE_OTHER): Payer: Medicaid Other | Admitting: Vascular Surgery

## 2018-05-16 ENCOUNTER — Encounter: Payer: Self-pay | Admitting: Family Medicine

## 2018-06-04 ENCOUNTER — Encounter: Payer: Self-pay | Admitting: Family Medicine

## 2018-06-04 DIAGNOSIS — I10 Essential (primary) hypertension: Secondary | ICD-10-CM

## 2018-06-04 DIAGNOSIS — E039 Hypothyroidism, unspecified: Secondary | ICD-10-CM

## 2018-06-04 MED ORDER — HYDROCHLOROTHIAZIDE 12.5 MG PO CAPS: 12.5000 mg | ORAL_CAPSULE | Freq: Every day | ORAL | 3 refills | Status: DC

## 2018-06-04 MED ORDER — LEVOTHYROXINE SODIUM 200 MCG PO TABS: 200.0000 ug | ORAL_TABLET | Freq: Every day | ORAL | 3 refills | Status: DC

## 2018-06-11 ENCOUNTER — Ambulatory Visit: Payer: Self-pay | Admitting: Obstetrics and Gynecology

## 2018-06-23 DIAGNOSIS — Z79899 Other long term (current) drug therapy: Secondary | ICD-10-CM | POA: Diagnosis not present

## 2018-06-23 DIAGNOSIS — I10 Essential (primary) hypertension: Secondary | ICD-10-CM | POA: Diagnosis not present

## 2018-06-23 DIAGNOSIS — R29898 Other symptoms and signs involving the musculoskeletal system: Secondary | ICD-10-CM | POA: Diagnosis not present

## 2018-06-23 DIAGNOSIS — E785 Hyperlipidemia, unspecified: Secondary | ICD-10-CM | POA: Diagnosis not present

## 2018-06-23 DIAGNOSIS — G629 Polyneuropathy, unspecified: Secondary | ICD-10-CM | POA: Diagnosis not present

## 2018-06-23 DIAGNOSIS — E039 Hypothyroidism, unspecified: Secondary | ICD-10-CM | POA: Diagnosis not present

## 2018-06-23 DIAGNOSIS — R600 Localized edema: Secondary | ICD-10-CM | POA: Diagnosis not present

## 2018-06-23 DIAGNOSIS — Z808 Family history of malignant neoplasm of other organs or systems: Secondary | ICD-10-CM | POA: Diagnosis not present

## 2018-06-23 DIAGNOSIS — M79606 Pain in leg, unspecified: Secondary | ICD-10-CM | POA: Diagnosis not present

## 2018-06-23 DIAGNOSIS — G609 Hereditary and idiopathic neuropathy, unspecified: Secondary | ICD-10-CM | POA: Diagnosis not present

## 2018-06-30 DIAGNOSIS — R7303 Prediabetes: Secondary | ICD-10-CM | POA: Diagnosis not present

## 2018-06-30 DIAGNOSIS — E538 Deficiency of other specified B group vitamins: Secondary | ICD-10-CM | POA: Diagnosis not present

## 2018-08-08 DIAGNOSIS — E538 Deficiency of other specified B group vitamins: Secondary | ICD-10-CM | POA: Diagnosis not present

## 2018-09-17 ENCOUNTER — Encounter: Payer: Self-pay | Admitting: Family Medicine

## 2018-09-24 ENCOUNTER — Ambulatory Visit: Payer: Medicaid Other | Admitting: Family Medicine

## 2018-10-21 ENCOUNTER — Ambulatory Visit: Payer: Medicaid Other

## 2018-10-21 ENCOUNTER — Other Ambulatory Visit: Payer: Self-pay

## 2018-10-21 ENCOUNTER — Encounter: Payer: Self-pay | Admitting: Orthopaedic Surgery

## 2018-10-21 ENCOUNTER — Ambulatory Visit: Payer: Medicaid Other | Admitting: Orthopaedic Surgery

## 2018-10-21 VITALS — BP 120/71 | HR 50 | Temp 97.0°F | Ht 74.0 in | Wt 271.4 lb

## 2018-10-21 DIAGNOSIS — G8929 Other chronic pain: Secondary | ICD-10-CM | POA: Diagnosis not present

## 2018-10-21 DIAGNOSIS — M25561 Pain in right knee: Secondary | ICD-10-CM

## 2018-10-21 DIAGNOSIS — Z7689 Persons encountering health services in other specified circumstances: Secondary | ICD-10-CM | POA: Diagnosis not present

## 2018-10-21 MED ORDER — NAPROXEN 500 MG PO TABS: 500.0000 mg | ORAL_TABLET | Freq: Two times a day (BID) | ORAL | 5 refills | Status: DC

## 2018-10-21 NOTE — Patient Instructions (Signed)
Journal for Nurse Practitioners, 15(4), 263-267. Retrieved October 21, 2017 from http://clinicalkey.com/nursing">  Knee Exercises Ask your health care provider which exercises are safe for you. Do exercises exactly as told by your health care provider and adjust them as directed. It is normal to feel mild stretching, pulling, tightness, or discomfort as you do these exercises. Stop right away if you feel sudden pain or your pain gets worse. Do not begin these exercises until told by your health care provider. Stretching and range-of-motion exercises These exercises warm up your muscles and joints and improve the movement and flexibility of your knee. These exercises also help to relieve pain and swelling. Knee extension, prone 1. Lie on your abdomen (prone position) on a bed. 2. Place your left / right knee just beyond the edge of the surface so your knee is not on the bed. You can put a towel under your left / right thigh just above your kneecap for comfort. 3. Relax your leg muscles and allow gravity to straighten your knee (extension). You should feel a stretch behind your left / right knee. 4. Hold this position for __________ seconds. 5. Scoot up so your knee is supported between repetitions. Repeat __________ times. Complete this exercise __________ times a day. Knee flexion, active  1. Lie on your back with both legs straight. If this causes back discomfort, bend your left / right knee so your foot is flat on the floor. 2. Slowly slide your left / right heel back toward your buttocks. Stop when you feel a gentle stretch in the front of your knee or thigh (flexion). 3. Hold this position for __________ seconds. 4. Slowly slide your left / right heel back to the starting position. Repeat __________ times. Complete this exercise __________ times a day. Quadriceps stretch, prone  1. Lie on your abdomen on a firm surface, such as a bed or padded floor. 2. Bend your left / right knee and hold  your ankle. If you cannot reach your ankle or pant leg, loop a belt around your foot and grab the belt instead. 3. Gently pull your heel toward your buttocks. Your knee should not slide out to the side. You should feel a stretch in the front of your thigh and knee (quadriceps). 4. Hold this position for __________ seconds. Repeat __________ times. Complete this exercise __________ times a day. Hamstring, supine 1. Lie on your back (supine position). 2. Loop a belt or towel over the ball of your left / right foot. The ball of your foot is on the walking surface, right under your toes. 3. Straighten your left / right knee and slowly pull on the belt to raise your leg until you feel a gentle stretch behind your knee (hamstring). ? Do not let your knee bend while you do this. ? Keep your other leg flat on the floor. 4. Hold this position for __________ seconds. Repeat __________ times. Complete this exercise __________ times a day. Strengthening exercises These exercises build strength and endurance in your knee. Endurance is the ability to use your muscles for a long time, even after they get tired. Quadriceps, isometric This exercise stretches the muscles in front of your thigh (quadriceps) without moving your knee joint (isometric). 1. Lie on your back with your left / right leg extended and your other knee bent. Put a rolled towel or small pillow under your knee if told by your health care provider. 2. Slowly tense the muscles in the front of your left /   right thigh. You should see your kneecap slide up toward your hip or see increased dimpling just above the knee. This motion will push the back of the knee toward the floor. 3. For __________ seconds, hold the muscle as tight as you can without increasing your pain. 4. Relax the muscles slowly and completely. Repeat __________ times. Complete this exercise __________ times a day. Straight leg raises This exercise stretches the muscles in front  of your thigh (quadriceps) and the muscles that move your hips (hip flexors). 1. Lie on your back with your left / right leg extended and your other knee bent. 2. Tense the muscles in the front of your left / right thigh. You should see your kneecap slide up or see increased dimpling just above the knee. Your thigh may even shake a bit. 3. Keep these muscles tight as you raise your leg 4-6 inches (10-15 cm) off the floor. Do not let your knee bend. 4. Hold this position for __________ seconds. 5. Keep these muscles tense as you lower your leg. 6. Relax your muscles slowly and completely after each repetition. Repeat __________ times. Complete this exercise __________ times a day. Hamstring, isometric 1. Lie on your back on a firm surface. 2. Bend your left / right knee about __________ degrees. 3. Dig your left / right heel into the surface as if you are trying to pull it toward your buttocks. Tighten the muscles in the back of your thighs (hamstring) to "dig" as hard as you can without increasing any pain. 4. Hold this position for __________ seconds. 5. Release the tension gradually and allow your muscles to relax completely for __________ seconds after each repetition. Repeat __________ times. Complete this exercise __________ times a day. Hamstring curls If told by your health care provider, do this exercise while wearing ankle weights. Begin with __________ lb weights. Then increase the weight by 1 lb (0.5 kg) increments. Do not wear ankle weights that are more than __________ lb. 1. Lie on your abdomen with your legs straight. 2. Bend your left / right knee as far as you can without feeling pain. Keep your hips flat against the floor. 3. Hold this position for __________ seconds. 4. Slowly lower your leg to the starting position. Repeat __________ times. Complete this exercise __________ times a day. Squats This exercise strengthens the muscles in front of your thigh and knee  (quadriceps). 1. Stand in front of a table, with your feet and knees pointing straight ahead. You may rest your hands on the table for balance but not for support. 2. Slowly bend your knees and lower your hips like you are going to sit in a chair. ? Keep your weight over your heels, not over your toes. ? Keep your lower legs upright so they are parallel with the table legs. ? Do not let your hips go lower than your knees. ? Do not bend lower than told by your health care provider. ? If your knee pain increases, do not bend as low. 3. Hold the squat position for __________ seconds. 4. Slowly push with your legs to return to standing. Do not use your hands to pull yourself to standing. Repeat __________ times. Complete this exercise __________ times a day. Wall slides This exercise strengthens the muscles in front of your thigh and knee (quadriceps). 1. Lean your back against a smooth wall or door, and walk your feet out 18-24 inches (46-61 cm) from it. 2. Place your feet hip-width apart. 3.   Slowly slide down the wall or door until your knees bend __________ degrees. Keep your knees over your heels, not over your toes. Keep your knees in line with your hips. 4. Hold this position for __________ seconds. Repeat __________ times. Complete this exercise __________ times a day. Straight leg raises This exercise strengthens the muscles that rotate the leg at the hip and move it away from your body (hip abductors). 1. Lie on your side with your left / right leg in the top position. Lie so your head, shoulder, knee, and hip line up. You may bend your bottom knee to help you keep your balance. 2. Roll your hips slightly forward so your hips are stacked directly over each other and your left / right knee is facing forward. 3. Leading with your heel, lift your top leg 4-6 inches (10-15 cm). You should feel the muscles in your outer hip lifting. ? Do not let your foot drift forward. ? Do not let your knee  roll toward the ceiling. 4. Hold this position for __________ seconds. 5. Slowly return your leg to the starting position. 6. Let your muscles relax completely after each repetition. Repeat __________ times. Complete this exercise __________ times a day. Straight leg raises This exercise stretches the muscles that move your hips away from the front of the pelvis (hip extensors). 1. Lie on your abdomen on a firm surface. You can put a pillow under your hips if that is more comfortable. 2. Tense the muscles in your buttocks and lift your left / right leg about 4-6 inches (10-15 cm). Keep your knee straight as you lift your leg. 3. Hold this position for __________ seconds. 4. Slowly lower your leg to the starting position. 5. Let your leg relax completely after each repetition. Repeat __________ times. Complete this exercise __________ times a day. This information is not intended to replace advice given to you by your health care provider. Make sure you discuss any questions you have with your health care provider. Document Released: 11/15/2004 Document Revised: 10/22/2017 Document Reviewed: 10/22/2017 Elsevier Patient Education  2020 [employer]  

## 2018-10-21 NOTE — Progress Notes (Signed)
Subjective:    Patient ID: Dana Koch, female    DOB: 1955/02/29, 63 y.o.   MRN: [id number]  HPI She fell on July 31, 2018 and hurt her right knee.  She was in [place] and did not seek any medical attention as she had no way to doctor and hospital.  She eventually came back home in the last few weeks.  Her right knee is still hurting.  She has a cane. She has no giving way. She has popping and swelling.  She says she is no better.  Nothing helps.  She has no redness.  She has no other joint pain.   Review of Systems  Constitutional: Positive for activity change.  Musculoskeletal: Positive for arthralgias, gait problem and joint swelling.  All other systems reviewed and are negative.  For Review of Systems, all other systems reviewed and are negative.  The following is a summary of the past history medically, past history surgically, known current medicines, social history and family history.  This information is gathered electronically by the computer from prior information and documentation.  I review this each visit and have found including this information at this point in the chart is beneficial and informative.   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 90 capsule 3   levothyroxine (SYNTHROID) 200 MCG tablet Take 1 tablet (200 mcg total) by mouth daily. 90 tablet 3   naproxen sodium (ALEVE) 220 MG tablet Take 220  mg by mouth.     Omega-3 Fatty Acids (SALMON OIL-1000 PO) Take 3,000 mg by mouth daily.     Coenzyme Q10 400 MG CAPS Take by mouth.     No current facility-administered medications on file prior to visit.     Social History   Socioeconomic History   Marital status: Legally Separated    Spouse name: Not on file   Number of children: Not on file   Years of education: Not on file   Highest education level: Not on file  Occupational History   Not on file  Social Needs   Financial resource strain: Not on file   Food insecurity    Worry: Not on file    Inability: Not on file   Transportation needs    Medical: Not on file    Non-medical: Not on file  Tobacco Use   Smoking status: Former Smoker    Packs/day: 3.00    Years: 40.00    Pack years: 120.00    Types: Cigarettes    Start date: 12/04/1972    Quit date: 01/03/2013    Years since quitting: 5.8   Smokeless tobacco: Never Used  Substance and Sexual Activity   Alcohol use: No    Alcohol/week: 0.0 standard drinks   Drug use: No   Sexual activity: Not Currently    Birth control/protection: Post-menopausal  Lifestyle  Physical activity    Days per week: Not on file    Minutes per session: Not on file   Stress: Not on file  Relationships   Social connections    Talks on phone: Not on file    Gets together: Not on file    Attends religious service: Not on file    Active member of club or organization: Not on file    Attends meetings of clubs or organizations: Not on file    Relationship status: Not on file   Intimate partner violence    Fear of current or ex partner: Not on file    Emotionally abused: Not on file    Physically abused: Not on file    Forced sexual activity: Not on file  Other Topics Concern   Not on file  Social History Narrative   Not on file    Family History  Problem Relation Age of Onset   Thyroid disease Mother    Bone cancer Mother    Thyroid disease Father     Emphysema Father    Diabetes Father    Neuropathy Father    Diabetes Brother    Thyroid disease Brother    Thyroid disease Sister     BP 120/71    Pulse (!) 50    Temp (!) 97 F (36.1 C)    Ht 6\' 2"  (1.88 m)    Wt 271 lb 6.4 oz (123.1 kg)    BMI 34.85 kg/m   Body mass index is 34.85 kg/m.      Objective:   Physical Exam Vitals signs reviewed.  Constitutional:      Appearance: She is well-developed.  HENT:     Head: Normocephalic and atraumatic.  Eyes:     Conjunctiva/sclera: Conjunctivae normal.     Pupils: Pupils are equal, round, and reactive to light.  Neck:     Musculoskeletal: Normal range of motion and neck supple.  Cardiovascular:     Rate and Rhythm: Normal rate and regular rhythm.  Pulmonary:     Effort: Pulmonary effort is normal.  Abdominal:     Palpations: Abdomen is soft.  Musculoskeletal:     Right knee: She exhibits swelling. Tenderness found. Medial joint line tenderness noted.       Legs:  Skin:    General: Skin is warm and dry.  Neurological:     Mental Status: She is alert and oriented to person, place, and time.     Cranial Nerves: No cranial nerve deficit.     Motor: No abnormal muscle tone.     Coordination: Coordination normal.     Deep Tendon Reflexes: Reflexes are normal and symmetric. Reflexes normal.  Psychiatric:        Behavior: Behavior normal.        Thought Content: Thought content normal.        Judgment: Judgment normal.    X-rays of the right knee were done, reported separately.       Assessment & Plan:   Encounter Diagnosis  Name Primary?   Chronic pain of right knee Yes   I feel she needs a MRI.  She needs to wait six weeks for insurance to pay for it.  I will begin Naprosyn.  Continue the cane.  Return in six weeks.  Order MRI then if still having symptoms.  I am concerned about a medial meniscus tear.  Electronically Signed , MD 10/6/20209:57 AM

## 2018-10-22 ENCOUNTER — Ambulatory Visit: Payer: Medicaid Other | Admitting: Family Medicine

## 2018-10-28 DIAGNOSIS — R300 Dysuria: Secondary | ICD-10-CM | POA: Diagnosis not present

## 2018-10-28 DIAGNOSIS — D069 Carcinoma in situ of cervix, unspecified: Secondary | ICD-10-CM | POA: Diagnosis not present

## 2018-10-28 DIAGNOSIS — Z01411 Encounter for gynecological examination (general) (routine) with abnormal findings: Secondary | ICD-10-CM | POA: Diagnosis not present

## 2018-10-28 DIAGNOSIS — R87610 Atypical squamous cells of undetermined significance on cytologic smear of cervix (ASC-US): Secondary | ICD-10-CM | POA: Diagnosis not present

## 2018-10-28 DIAGNOSIS — R87613 High grade squamous intraepithelial lesion on cytologic smear of cervix (HGSIL): Secondary | ICD-10-CM | POA: Diagnosis not present

## 2018-10-28 DIAGNOSIS — Z23 Encounter for immunization: Secondary | ICD-10-CM | POA: Diagnosis not present

## 2018-10-30 ENCOUNTER — Telehealth: Payer: Self-pay | Admitting: Orthopaedic Surgery
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
## 2018-11-04 NOTE — Telephone Encounter (Signed)
Spoke with patient-relayed; voiced understanding.  States has had another medical matter to come up: states is scheduled to have hysterectomy(robotic) in [facility] by Dr Shelly Flatten, [facility], on 11/27/18. Asking if she can (1) still be considered for MRI status post surgery, and (2) can she have her next visit (12/02/18) changed to virtual?

## 2018-11-04 NOTE — Telephone Encounter (Signed)
I am aware of that and noted that to her on the visit and in the x-ray report. But always good to double check.

## 2018-11-18 ENCOUNTER — Telehealth: Payer: Self-pay | Admitting: Family Medicine

## 2018-11-18 ENCOUNTER — Other Ambulatory Visit: Payer: Self-pay

## 2018-11-19 ENCOUNTER — Encounter: Payer: Self-pay | Admitting: Family Medicine

## 2018-11-19 ENCOUNTER — Ambulatory Visit: Payer: Medicaid Other | Admitting: Family Medicine

## 2018-11-19 VITALS — BP 126/78 | HR 68 | Temp 96.8°F | Ht 74.0 in | Wt 277.6 lb

## 2018-11-19 DIAGNOSIS — E785 Hyperlipidemia, unspecified: Secondary | ICD-10-CM

## 2018-11-19 DIAGNOSIS — Z7689 Persons encountering health services in other specified circumstances: Secondary | ICD-10-CM | POA: Diagnosis not present

## 2018-11-19 DIAGNOSIS — I10 Essential (primary) hypertension: Secondary | ICD-10-CM

## 2018-11-19 DIAGNOSIS — E039 Hypothyroidism, unspecified: Secondary | ICD-10-CM | POA: Diagnosis not present

## 2018-11-19 NOTE — Progress Notes (Signed)
BP 126/78   Pulse 68   Temp (!) 96.8 F (36 C) (Temporal)   Ht 6' 2"  (1.88 m)   Wt 277 lb 9.6 oz (125.9 kg)   SpO2 100%   BMI 35.64 kg/m    Subjective:   Patient ID: Dana Koch, female    DOB: 1955/09/20, 63 y.o.   MRN: [id number]  HPI: Dana Koch is a 63 y.o. female presenting on 11/19/2018 for Hypertension (check up of chronic medical conditions), Hypothyroidism, and Memory Loss (ongoing)   HPI Hypothyroidism recheck Patient is coming in for thyroid recheck today as well. They deny any issues with hair changes or heat or cold problems or diarrhea or constipation. They deny any chest pain or palpitations. They are currently on levothyroxine 200 micrograms   Hypertension Patient is currently on hydrochlorothiazide, and their blood pressure today is 126/78. Patient denies any lightheadedness or dizziness. Patient denies headaches, blurred vision, chest pains, shortness of breath, or weakness. Denies any side effects from medication and is content with current medication.   Hyperlipidemia Patient is coming in for recheck of his hyperlipidemia. The patient is currently taking no medication, has been try to do diet control, give a fish oils, will check. They deny any issues with myalgias or history of liver damage from it. They deny any focal numbness or weakness or chest pain.  He is morbidly obese we have discussed options on reducing and she will try some things on her own at first.  Patient feels like she is having some memory issues where things eventually come to her by taking a longer to come for some to told her something a couple days ago and she cannot remember.  Is mostly short-term memory that she has noticed.  She can remember a lot of things or it eventually comes to her but does not come right away.  She does have dementia running in both sides of her family   Relevant past medical, surgical, family and social history reviewed and updated as indicated. Interim medical  history since our last visit reviewed. Allergies and medications reviewed and updated.  Review of Systems  Constitutional: Negative for chills and fever.  Eyes: Negative for redness and visual disturbance.  Respiratory: Negative for chest tightness and shortness of breath.   Cardiovascular: Negative for chest pain and leg swelling.  Musculoskeletal: Negative for back pain and gait problem.  Skin: Negative for rash.  Neurological: Negative for light-headedness and headaches.  Psychiatric/Behavioral: Negative for agitation, behavioral problems, dysphoric mood, self-injury, sleep disturbance and suicidal ideas. The patient is not nervous/anxious.   All other systems reviewed and are negative.   Per HPI unless specifically indicated above   Allergies as of 11/19/2018      Reactions   Demerol [meperidine] Other (See Comments)   Stops my heart   Statins Other (See Comments), Shortness Of Breath   sob   Amoxicillin Hives   Red Yeast Rice [cholestin] Itching      Medication List       Accurate as of November 19, 2018  9:31 AM. If you have any questions, ask your nurse or doctor.        STOP taking these medications   Coenzyme Q10 400 MG Caps Stopped by: Worthy Rancher, MD   SALMON OIL-1000 PO Stopped by: Fransisca Kaufmann Dettinger, MD     TAKE these medications   B-12 1000 MCG Caps Take by mouth.   hydrochlorothiazide 12.5 MG capsule Commonly known as: MICROZIDE Take  1 capsule (12.5 mg total) by mouth daily.   levothyroxine 200 MCG tablet Commonly known as: SYNTHROID Take 1 tablet (200 mcg total) by mouth daily.   naproxen 500 MG tablet Commonly known as: NAPROSYN Take 1 tablet (500 mg total) by mouth 2 (two) times daily with a meal.   naproxen sodium 220 MG tablet Commonly known as: ALEVE Take 220 mg by mouth.        Objective:   BP 126/78   Pulse 68   Temp (!) 96.8 F (36 C) (Temporal)   Ht 6' 2"  (1.88 m)   Wt 277 lb 9.6 oz (125.9 kg)   SpO2 100%   BMI  35.64 kg/m   Wt Readings from Last 3 Encounters:  11/19/18 277 lb 9.6 oz (125.9 kg)  10/21/18 271 lb 6.4 oz (123.1 kg)  03/20/18 (!) 359 lb 6.4 oz (163 kg)    Physical Exam Vitals signs and nursing note reviewed.  Constitutional:      General: She is not in acute distress.    Appearance: She is well-developed. She is not diaphoretic.  Eyes:     Conjunctiva/sclera: Conjunctivae normal.  Cardiovascular:     Rate and Rhythm: Normal rate and regular rhythm.     Heart sounds: Normal heart sounds. No murmur.  Pulmonary:     Effort: Pulmonary effort is normal. No respiratory distress.     Breath sounds: Normal breath sounds. No wheezing.  Musculoskeletal: Normal range of motion.        General: No tenderness.  Skin:    General: Skin is warm and dry.     Findings: No rash.  Neurological:     Mental Status: She is alert and oriented to person, place, and time.     Coordination: Coordination normal.  Psychiatric:        Behavior: Behavior normal.    MMSE - Mini Mental State Exam 11/19/2018  Orientation to time 5  Orientation to Place 5  Registration 3  Attention/ Calculation 5  Recall 3  Language- name 2 objects 2  Language- repeat 1  Language- follow 3 step command 3  Language- read & follow direction 1  Write a sentence 1  Copy design 1  Total score 30     Assessment & Plan:   Problem List Items Addressed This Visit      Cardiovascular and Mediastinum   Essential hypertension   Relevant Orders   CMP14+EGFR     Endocrine   Hypothyroidism - Primary   Relevant Orders   TSH     Other   Morbid obesity ([place])   Dyslipidemia, goal LDL below 100   Relevant Orders   Lipid panel    Will check thyroid dose and cholesterol, she will come back fasting for these.  Continue current medication and will assess based off of labs.  MMSE looks good, likely age-related memory, recommended for her to do memory challenges every day such as crossword  Follow up plan: Return in  about 6 months (around 05/19/2019), or if symptoms worsen or fail to improve, for Recheck thyroid and hypertension.  Counseling provided for all of the vaccine components Orders Placed This Encounter  Procedures  . CMP14+EGFR  . Lipid panel  . TSH    Caryl Pina, MD [facility] 11/19/2018, 9:31 AM

## 2018-11-21 DIAGNOSIS — Z20828 Contact with and (suspected) exposure to other viral communicable diseases: Secondary | ICD-10-CM | POA: Diagnosis not present

## 2018-11-21 DIAGNOSIS — D069 Carcinoma in situ of cervix, unspecified: Secondary | ICD-10-CM | POA: Diagnosis not present

## 2018-11-21 DIAGNOSIS — Z01812 Encounter for preprocedural laboratory examination: Secondary | ICD-10-CM | POA: Diagnosis not present

## 2018-11-27 DIAGNOSIS — N840 Polyp of corpus uteri: Secondary | ICD-10-CM | POA: Diagnosis not present

## 2018-11-27 DIAGNOSIS — R87613 High grade squamous intraepithelial lesion on cytologic smear of cervix (HGSIL): Secondary | ICD-10-CM | POA: Diagnosis not present

## 2018-11-27 DIAGNOSIS — Z01818 Encounter for other preprocedural examination: Secondary | ICD-10-CM | POA: Diagnosis not present

## 2018-12-02 ENCOUNTER — Ambulatory Visit: Payer: Medicaid Other | Admitting: Orthopaedic Surgery

## 2018-12-18 ENCOUNTER — Ambulatory Visit (INDEPENDENT_AMBULATORY_CARE_PROVIDER_SITE_OTHER): Payer: Medicaid Other

## 2018-12-18 ENCOUNTER — Other Ambulatory Visit: Payer: Self-pay

## 2018-12-18 ENCOUNTER — Other Ambulatory Visit: Payer: Medicaid Other

## 2018-12-18 DIAGNOSIS — E039 Hypothyroidism, unspecified: Secondary | ICD-10-CM | POA: Diagnosis not present

## 2018-12-18 DIAGNOSIS — E785 Hyperlipidemia, unspecified: Secondary | ICD-10-CM

## 2018-12-18 DIAGNOSIS — I10 Essential (primary) hypertension: Secondary | ICD-10-CM

## 2018-12-18 DIAGNOSIS — Z23 Encounter for immunization: Secondary | ICD-10-CM | POA: Diagnosis not present

## 2018-12-19 LAB — CMP14+EGFR
ALT: 11 IU/L (ref 0–32)
AST: 13 IU/L (ref 0–40)
Albumin/Globulin Ratio: 1.7 (ref 1.2–2.2)
Albumin: 4.5 g/dL (ref 3.8–4.8)
Alkaline Phosphatase: 96 IU/L (ref 39–117)
BUN/Creatinine Ratio: 28 (ref 12–28)
BUN: 18 mg/dL (ref 8–27)
Bilirubin Total: 0.3 mg/dL (ref 0.0–1.2)
CO2: 29 mmol/L (ref 20–29)
Calcium: 9.9 mg/dL (ref 8.7–10.3)
Chloride: 101 mmol/L (ref 96–106)
Creatinine, Ser: 0.65 mg/dL (ref 0.57–1.00)
GFR calc Af Amer: 110 mL/min/{1.73_m2} (ref >59)
GFR calc non Af Amer: 95 mL/min/{1.73_m2} (ref >59)
Globulin, Total: 2.7 g/dL (ref 1.5–4.5)
Glucose: 92 mg/dL (ref 65–99)
Potassium: 4.2 mmol/L (ref 3.5–5.2)
Sodium: 143 mmol/L (ref 134–144)
Total Protein: 7.2 g/dL (ref 6.0–8.5)

## 2018-12-19 LAB — LIPID PANEL
Chol/HDL Ratio: 3.6 ratio (ref 0.0–4.4)
Cholesterol, Total: 222 mg/dL — ABNORMAL HIGH (ref 100–199)
HDL: 62 mg/dL (ref >39)
LDL Chol Calc (NIH): 136 mg/dL — ABNORMAL HIGH (ref 0–99)
Triglycerides: 136 mg/dL (ref 0–149)
VLDL Cholesterol Cal: 24 mg/dL (ref 5–40)

## 2018-12-19 LAB — TSH: TSH: 1.24 u[IU]/mL (ref 0.450–4.500)

## 2019-01-17 ENCOUNTER — Encounter: Payer: Self-pay | Admitting: Obstetrics and Gynecology

## 2019-01-17 DIAGNOSIS — D069 Carcinoma in situ of cervix, unspecified: Secondary | ICD-10-CM | POA: Insufficient documentation

## 2019-03-02 ENCOUNTER — Telehealth: Payer: Self-pay | Admitting: Family Medicine

## 2019-03-02 NOTE — Telephone Encounter (Signed)
Daughter and mother did not have on mask. Not sure if daughter wears mask at work. She will monitor symptoms and call back if needs [place].

## 2019-04-09 ENCOUNTER — Ambulatory Visit: Payer: Medicaid Other | Admitting: Family Medicine

## 2019-04-09 ENCOUNTER — Other Ambulatory Visit: Payer: Self-pay

## 2019-04-09 ENCOUNTER — Encounter: Payer: Self-pay | Admitting: Family Medicine

## 2019-04-09 VITALS — BP 129/78 | HR 69 | Temp 99.1°F | Ht 75.0 in | Wt 282.0 lb

## 2019-04-09 DIAGNOSIS — M25561 Pain in right knee: Secondary | ICD-10-CM

## 2019-04-09 DIAGNOSIS — Z7689 Persons encountering health services in other specified circumstances: Secondary | ICD-10-CM | POA: Diagnosis not present

## 2019-04-09 MED ORDER — FUROSEMIDE 20 MG PO TABS: 20.0000 mg | ORAL_TABLET | Freq: Every day | ORAL | 3 refills | Status: DC
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
## 2019-04-20 ENCOUNTER — Encounter: Payer: Self-pay | Admitting: Family Medicine

## 2019-04-20 ENCOUNTER — Other Ambulatory Visit: Payer: Self-pay | Admitting: Family Medicine

## 2019-04-28 ENCOUNTER — Other Ambulatory Visit: Payer: Self-pay | Admitting: Family Medicine

## 2019-04-28 DIAGNOSIS — M25561 Pain in right knee: Secondary | ICD-10-CM

## 2019-04-28 NOTE — Progress Notes (Unsigned)
Placed CT scan order for patient

## 2019-05-06 ENCOUNTER — Telehealth: Payer: Self-pay | Admitting: Family Medicine

## 2019-05-06 NOTE — Telephone Encounter (Signed)
Form has been placed on providers desk

## 2019-05-07 NOTE — Telephone Encounter (Signed)
Form faxed back.

## 2019-05-14 ENCOUNTER — Other Ambulatory Visit: Payer: Self-pay

## 2019-05-14 ENCOUNTER — Ambulatory Visit (HOSPITAL_COMMUNITY)
Admission: RE | Admit: 2019-05-14 | Discharge: 2019-05-14 | Disposition: A | Discharge status: Home / Self Care | Payer: Medicaid Other | Source: Ambulatory Visit | Attending: Family Medicine | Admitting: Family Medicine

## 2019-05-14 DIAGNOSIS — Z7689 Persons encountering health services in other specified circumstances: Secondary | ICD-10-CM | POA: Diagnosis not present

## 2019-05-14 DIAGNOSIS — M25561 Pain in right knee: Secondary | ICD-10-CM

## 2019-05-14 DIAGNOSIS — M1711 Unilateral primary osteoarthritis, right knee: Secondary | ICD-10-CM | POA: Diagnosis not present

## 2019-05-20 ENCOUNTER — Other Ambulatory Visit: Payer: Self-pay

## 2019-05-20 ENCOUNTER — Ambulatory Visit: Payer: Medicaid Other | Admitting: Family Medicine

## 2019-05-20 ENCOUNTER — Encounter: Payer: Self-pay | Admitting: Family Medicine

## 2019-05-20 VITALS — BP 124/72 | HR 67 | Temp 98.0°F | Ht 75.0 in | Wt 286.1 lb

## 2019-05-20 DIAGNOSIS — M25561 Pain in right knee: Secondary | ICD-10-CM

## 2019-05-20 DIAGNOSIS — Z7689 Persons encountering health services in other specified circumstances: Secondary | ICD-10-CM | POA: Diagnosis not present

## 2019-05-20 DIAGNOSIS — I10 Essential (primary) hypertension: Secondary | ICD-10-CM | POA: Diagnosis not present

## 2019-05-20 DIAGNOSIS — E785 Hyperlipidemia, unspecified: Secondary | ICD-10-CM | POA: Diagnosis not present

## 2019-05-20 DIAGNOSIS — E039 Hypothyroidism, unspecified: Secondary | ICD-10-CM | POA: Diagnosis not present

## 2019-05-20 MED ORDER — FUROSEMIDE 20 MG PO TABS: 20.0000 mg | ORAL_TABLET | Freq: Every day | ORAL | 3 refills | Status: DC

## 2019-05-20 NOTE — Progress Notes (Signed)
BP 124/72   Pulse 67   Temp 98 F (36.7 C) (Temporal)   Ht _0  (1.905 m)   Wt 286 lb 2 oz (129.8 kg)   BMI 35.76 kg/m    Subjective:   Patient ID: Dana Koch, female    DOB: 16-Jul-1955, 64 y.o.   MRN: [id number]  HPI: Dana Koch is a 64 y.o. female presenting on 05/20/2019 for Medical Management of Chronic Issues   HPI Hypothyroidism recheck Patient is coming in for thyroid recheck today as well. They deny any issues with hair changes or heat or cold problems or diarrhea or constipation. They deny any chest pain or palpitations. They are currently on levothyroxine 200 micrograms   Hypertension Patient is currently on furosemide and hydrochlorothiazide, and their blood pressure today is 124/72. Patient denies any lightheadedness or dizziness. Patient denies headaches, blurred vision, chest pains, shortness of breath, or weakness. Denies any side effects from medication and is content with current medication.   Hyperlipidemia Patient is coming in for recheck of his hyperlipidemia. The patient is currently taking fish oil and vinegar and co-Q10. They deny any issues with myalgias or history of liver damage from it. They deny any focal numbness or weakness or chest pain.   Relevant past medical, surgical, family and social history reviewed and updated as indicated. Interim medical history since our last visit reviewed. Allergies and medications reviewed and updated.  Review of Systems  Constitutional: Negative for chills and fever.  Eyes: Negative for visual disturbance.  Respiratory: Negative for chest tightness and shortness of breath.   Cardiovascular: Negative for chest pain and leg swelling.  Musculoskeletal: Negative for back pain and gait problem.  Skin: Negative for rash.  Neurological: Negative for dizziness, weakness, light-headedness and headaches.  Psychiatric/Behavioral: Negative for agitation and behavioral problems.  All other systems reviewed and are  negative.   Per HPI unless specifically indicated above   Allergies as of 05/20/2019      Reactions   Demerol [meperidine] Other (See Comments)   Stops my heart   Statins Other (See Comments), Shortness Of Breath   sob   Amoxicillin Hives   Red Yeast Rice [cholestin] Itching      Medication List       Accurate as of May 20, 2019  8:21 AM. If you have any questions, ask your nurse or doctor.        B-12 1000 MCG Caps Take by mouth.   CoQ-10 100 MG Caps   Fish Oil 1000 MG Caps   furosemide 20 MG tablet Commonly known as: LASIX Take 1 tablet (20 mg total) by mouth daily.   hydrochlorothiazide 12.5 MG capsule Commonly known as: MICROZIDE Take 1 capsule (12.5 mg total) by mouth daily.   levothyroxine 200 MCG tablet Commonly known as: SYNTHROID Take 1 tablet (200 mcg total) by mouth daily.        Objective:   BP 124/72   Pulse 67   Temp 98 F (36.7 C) (Temporal)   Ht _1  (1.905 m)   Wt 286 lb 2 oz (129.8 kg)   BMI 35.76 kg/m   Wt Readings from Last 3 Encounters:  05/20/19 286 lb 2 oz (129.8 kg)  04/09/19 282 lb (127.9 kg)  11/19/18 277 lb 9.6 oz (125.9 kg)    Physical Exam Vitals and nursing note reviewed.  Constitutional:      General: She is not in acute distress.    Appearance: She is well-developed. She is not  diaphoretic.  Eyes:     Conjunctiva/sclera: Conjunctivae normal.  Cardiovascular:     Rate and Rhythm: Normal rate and regular rhythm.     Heart sounds: Normal heart sounds. No murmur.  Pulmonary:     Effort: Pulmonary effort is normal. No respiratory distress.     Breath sounds: Normal breath sounds. No wheezing.  Musculoskeletal:        General: No tenderness. Normal range of motion.  Skin:    General: Skin is warm and dry.     Findings: No rash.  Neurological:     Mental Status: She is alert and oriented to person, place, and time.     Coordination: Coordination normal.  Psychiatric:        Behavior: Behavior normal.        Assessment & Plan:   Problem List Items Addressed This Visit      Cardiovascular and Mediastinum   Essential hypertension   Relevant Medications   furosemide (LASIX) 20 MG tablet   Other Relevant Orders   CMP14+EGFR     Endocrine   Hypothyroidism - Primary   Relevant Orders   CBC with Differential/Platelet   TSH     Other   Dyslipidemia, goal LDL below 100   Relevant Medications   furosemide (LASIX) 20 MG tablet   Other Relevant Orders   Lipid panel    Other Visit Diagnoses    Acute pain of right knee       Relevant Medications   furosemide (LASIX) 20 MG tablet      Continue current medication, swelling seems to be down with the furosemide. Follow up plan: Return in about 6 months (around 11/20/2019), or if symptoms worsen or fail to improve, for Hypertension and thyroid and cholesterol.  Counseling provided for all of the vaccine components No orders of the defined types were placed in this encounter.   Caryl Pina, MD [facility] 05/20/2019, 8:21 AM

## 2019-05-21 LAB — CBC WITH DIFFERENTIAL/PLATELET
Basophils Absolute: 0.1 10*3/uL (ref 0.0–0.2)
Basos: 1 %
EOS (ABSOLUTE): 0.6 10*3/uL — ABNORMAL HIGH (ref 0.0–0.4)
Eos: 10 %
Hematocrit: 42.3 % (ref 34.0–46.6)
Hemoglobin: 13.7 g/dL (ref 11.1–15.9)
Immature Grans (Abs): 0 10*3/uL (ref 0.0–0.1)
Immature Granulocytes: 1 %
Lymphocytes Absolute: 2 10*3/uL (ref 0.7–3.1)
Lymphs: 35 %
MCH: 28.6 pg (ref 26.6–33.0)
MCHC: 32.4 g/dL (ref 31.5–35.7)
MCV: 88 fL (ref 79–97)
Monocytes Absolute: 0.5 10*3/uL (ref 0.1–0.9)
Monocytes: 8 %
Neutrophils Absolute: 2.6 10*3/uL (ref 1.4–7.0)
Neutrophils: 45 %
Platelets: 230 10*3/uL (ref 150–450)
RBC: 4.79 x10E6/uL (ref 3.77–5.28)
RDW: 12.8 % (ref 11.7–15.4)
WBC: 5.8 10*3/uL (ref 3.4–10.8)

## 2019-05-21 LAB — LIPID PANEL
Chol/HDL Ratio: 3.6 ratio (ref 0.0–4.4)
Cholesterol, Total: 224 mg/dL — ABNORMAL HIGH (ref 100–199)
HDL: 63 mg/dL (ref >39)
LDL Chol Calc (NIH): 139 mg/dL — ABNORMAL HIGH (ref 0–99)
Triglycerides: 125 mg/dL (ref 0–149)
VLDL Cholesterol Cal: 22 mg/dL (ref 5–40)

## 2019-05-21 LAB — CMP14+EGFR
ALT: 12 IU/L (ref 0–32)
AST: 17 IU/L (ref 0–40)
Albumin/Globulin Ratio: 1.7 (ref 1.2–2.2)
Albumin: 4.8 g/dL (ref 3.8–4.8)
Alkaline Phosphatase: 101 IU/L (ref 39–117)
BUN/Creatinine Ratio: 23 (ref 12–28)
BUN: 17 mg/dL (ref 8–27)
Bilirubin Total: 0.3 mg/dL (ref 0.0–1.2)
CO2: 24 mmol/L (ref 20–29)
Calcium: 9.8 mg/dL (ref 8.7–10.3)
Chloride: 102 mmol/L (ref 96–106)
Creatinine, Ser: 0.75 mg/dL (ref 0.57–1.00)
GFR calc Af Amer: 99 mL/min/{1.73_m2} (ref >59)
GFR calc non Af Amer: 86 mL/min/{1.73_m2} (ref >59)
Globulin, Total: 2.8 g/dL (ref 1.5–4.5)
Glucose: 94 mg/dL (ref 65–99)
Potassium: 4.4 mmol/L (ref 3.5–5.2)
Sodium: 143 mmol/L (ref 134–144)
Total Protein: 7.6 g/dL (ref 6.0–8.5)

## 2019-05-21 LAB — TSH: TSH: 0.583 u[IU]/mL (ref 0.450–4.500)

## 2019-05-26 ENCOUNTER — Encounter: Payer: Self-pay | Admitting: Family Medicine

## 2019-05-27 MED ORDER — EZETIMIBE 10 MG PO TABS
10.0000 mg | ORAL_TABLET | Freq: Every day | ORAL | 3 refills | Status: DC
Start: 2019-05-27 — End: 2020-05-03

## 2019-06-03 ENCOUNTER — Other Ambulatory Visit: Payer: Self-pay | Admitting: Family Medicine

## 2019-06-03 DIAGNOSIS — E039 Hypothyroidism, unspecified: Secondary | ICD-10-CM

## 2019-06-03 DIAGNOSIS — I10 Essential (primary) hypertension: Secondary | ICD-10-CM

## 2019-08-25 ENCOUNTER — Other Ambulatory Visit: Payer: Self-pay | Admitting: *Deleted

## 2019-08-25 DIAGNOSIS — M25561 Pain in right knee: Secondary | ICD-10-CM

## 2019-08-25 MED ORDER — FUROSEMIDE 20 MG PO TABS: 20.0000 mg | ORAL_TABLET | Freq: Every day | ORAL | 0 refills | Status: DC

## 2019-10-14 ENCOUNTER — Telehealth: Payer: Self-pay | Admitting: Family Medicine

## 2019-10-14 NOTE — Telephone Encounter (Signed)
Appt made. Patient stated she called Monday and was told there was no appointments and there was nothing we could do for her patient is a little upset. Advised patient I have her information and would try to find out what happened on the telephone call.

## 2019-10-15 ENCOUNTER — Encounter: Payer: Self-pay | Admitting: Nurse Practitioner

## 2019-10-15 ENCOUNTER — Ambulatory Visit (INDEPENDENT_AMBULATORY_CARE_PROVIDER_SITE_OTHER): Payer: Medicaid Other | Admitting: Nurse Practitioner

## 2019-10-15 DIAGNOSIS — R61 Generalized hyperhidrosis: Secondary | ICD-10-CM | POA: Diagnosis not present

## 2019-10-15 DIAGNOSIS — R5383 Other fatigue: Secondary | ICD-10-CM

## 2019-10-15 DIAGNOSIS — R739 Hyperglycemia, unspecified: Secondary | ICD-10-CM

## 2019-10-15 NOTE — Addendum Note (Signed)
Addended by: Prescott Gum on: 10/15/2019 02:14 PM   Modules accepted: Orders

## 2019-10-15 NOTE — Progress Notes (Signed)
   Virtual Visit via telephone Note Due to COVID-19 pandemic this visit was conducted virtually. This visit type was conducted due to national recommendations for restrictions regarding the COVID-19 Pandemic (e.g. social distancing, sheltering in place) in an effort to limit this patient's exposure and mitigate transmission in our community. All issues noted in this document were discussed and addressed.  A physical exam was not performed with this format.  I connected with Dana Koch on 10/15/19 at 11:10 by telephone and verified that I am speaking with the correct person using two identifiers. Dana Koch is currently located at home and no one is currently with  her during visit. The provider, Mary-Margaret Hassell Done, FNP is located in their office at time of visit.  I discussed the limitations, risks, security and privacy concerns of performing an evaluation and management service by telephone and the availability of in person appointments. I also discussed with the patient that there may be a patient responsible charge related to this service. The patient expressed understanding and agreed to proceed.  Called 11:15- no answer- left message  History and Present Illness:   Chief Complaint: fatigue  HPI Patient calls in for telephone appointment. She is c/o waking up ringing wet with sweat for the last several weeks. She denies a fever. She is also c/o feeling really weak.   Review of Systems  Constitutional: Positive for diaphoresis and malaise/fatigue. Negative for chills, fever and weight loss.  HENT: Negative.   Respiratory: Negative.   Cardiovascular: Negative.   Genitourinary: Negative.   Musculoskeletal: Negative.  Negative for back pain, myalgias and neck pain.  Neurological: Negative.   Psychiatric/Behavioral: Negative.      Observations/Objective: Alert and oriented- answers all questions appropriately No distress    Assessment and Plan: Dana Koch in today with  chief complaint of Fatigue   1. Fatigue, unspecified type Waiting on lab results - CBC with Differential/Platelet; Future - CMP14+EGFR; Future - Thyroid Panel With TSH; Future  2. Night sweats Keep diary of events     Follow Up Instructions: prn    I discussed the assessment and treatment plan with the patient. The patient was provided an opportunity to ask questions and all were answered. The patient agreed with the plan and demonstrated an understanding of the instructions.   The patient was advised to call back or seek an in-person evaluation if the symptoms worsen or if the condition fails to improve as anticipated.  The above assessment and management plan was discussed with the patient. The patient verbalized understanding of and has agreed to the management plan. Patient is aware to call the clinic if symptoms persist or worsen. Patient is aware when to return to the clinic for a follow-up visit. Patient educated on when it is appropriate to go to the emergency department.   Time call ended:  11:29  I provided 19 minutes of non-face-to-face time during this encounter.    Mary-Margaret Hassell Done, FNP

## 2019-10-16 LAB — CBC WITH DIFFERENTIAL/PLATELET
Basophils Absolute: 0 10*3/uL (ref 0.0–0.2)
Basos: 0 %
EOS (ABSOLUTE): 0.4 10*3/uL (ref 0.0–0.4)
Eos: 4 %
Hematocrit: 36.9 % (ref 34.0–46.6)
Hemoglobin: 12 g/dL (ref 11.1–15.9)
Immature Grans (Abs): 0.2 10*3/uL — ABNORMAL HIGH (ref 0.0–0.1)
Immature Granulocytes: 3 %
Lymphocytes Absolute: 2.5 10*3/uL (ref 0.7–3.1)
Lymphs: 31 %
MCH: 28.3 pg (ref 26.6–33.0)
MCHC: 32.5 g/dL (ref 31.5–35.7)
MCV: 87 fL (ref 79–97)
Monocytes Absolute: 0.4 10*3/uL (ref 0.1–0.9)
Monocytes: 4 %
Neutrophils Absolute: 4.8 10*3/uL (ref 1.4–7.0)
Neutrophils: 58 %
Platelets: 465 10*3/uL — ABNORMAL HIGH (ref 150–450)
RBC: 4.24 x10E6/uL (ref 3.77–5.28)
RDW: 13.1 % (ref 11.7–15.4)
WBC: 8.3 10*3/uL (ref 3.4–10.8)

## 2019-10-16 LAB — CMP14+EGFR
ALT: 28 IU/L (ref 0–32)
AST: 26 IU/L (ref 0–40)
Albumin/Globulin Ratio: 1.4 (ref 1.2–2.2)
Albumin: 3.8 g/dL (ref 3.8–4.8)
Alkaline Phosphatase: 93 IU/L (ref 44–121)
BUN/Creatinine Ratio: 15 (ref 12–28)
BUN: 9 mg/dL (ref 8–27)
Bilirubin Total: 0.3 mg/dL (ref 0.0–1.2)
CO2: 31 mmol/L — ABNORMAL HIGH (ref 20–29)
Calcium: 9.1 mg/dL (ref 8.7–10.3)
Chloride: 94 mmol/L — ABNORMAL LOW (ref 96–106)
Creatinine, Ser: 0.62 mg/dL (ref 0.57–1.00)
GFR calc Af Amer: 112 mL/min/{1.73_m2} (ref >59)
GFR calc non Af Amer: 97 mL/min/{1.73_m2} (ref >59)
Globulin, Total: 2.7 g/dL (ref 1.5–4.5)
Glucose: 132 mg/dL — ABNORMAL HIGH (ref 65–99)
Potassium: 3.1 mmol/L — ABNORMAL LOW (ref 3.5–5.2)
Sodium: 140 mmol/L (ref 134–144)
Total Protein: 6.5 g/dL (ref 6.0–8.5)

## 2019-10-16 LAB — THYROID PANEL WITH TSH
Free Thyroxine Index: 3.5 (ref 1.2–4.9)
T3 Uptake Ratio: 34 % (ref 24–39)
T4, Total: 10.3 ug/dL (ref 4.5–12.0)
TSH: 5.51 u[IU]/mL — ABNORMAL HIGH (ref 0.450–4.500)

## 2019-10-16 NOTE — Addendum Note (Signed)
Addended by: Bennie Pierini on: 10/16/2019 01:04 PM   Modules accepted: Orders

## 2019-10-16 NOTE — Telephone Encounter (Signed)
Listened to recorded call.  We did not have any available appointments and patient was instructed to call back the next morning to see if we could get her in the same day slots or go to urgent care/ED if necessary.

## 2019-10-19 LAB — SPECIMEN STATUS REPORT

## 2019-10-19 LAB — HGB A1C W/O EAG: Hgb A1c MFr Bld: 6.3 % — ABNORMAL HIGH (ref 4.8–5.6)

## 2019-11-20 ENCOUNTER — Other Ambulatory Visit: Payer: Self-pay | Admitting: Family Medicine

## 2019-11-20 ENCOUNTER — Ambulatory Visit (INDEPENDENT_AMBULATORY_CARE_PROVIDER_SITE_OTHER): Payer: Medicaid Other | Admitting: Family Medicine

## 2019-11-20 ENCOUNTER — Other Ambulatory Visit: Payer: Self-pay

## 2019-11-20 ENCOUNTER — Encounter: Payer: Self-pay | Admitting: Family Medicine

## 2019-11-20 VITALS — BP 140/70 | HR 70 | Temp 97.0°F | Ht 75.0 in | Wt 320.0 lb

## 2019-11-20 DIAGNOSIS — R413 Other amnesia: Secondary | ICD-10-CM

## 2019-11-20 DIAGNOSIS — I10 Essential (primary) hypertension: Secondary | ICD-10-CM | POA: Diagnosis not present

## 2019-11-20 DIAGNOSIS — E039 Hypothyroidism, unspecified: Secondary | ICD-10-CM | POA: Diagnosis not present

## 2019-11-20 DIAGNOSIS — Z23 Encounter for immunization: Secondary | ICD-10-CM | POA: Diagnosis not present

## 2019-11-20 MED ORDER — DONEPEZIL HCL 10 MG PO TABS: 10.0000 mg | ORAL_TABLET | Freq: Every day | ORAL | 3 refills | Status: DC

## 2019-11-20 NOTE — Progress Notes (Signed)
BP 140/70   Pulse 70   Temp (!) 97 F (36.1 C)   Ht 6\' 3"  (1.905 m)   Wt (!) 320 lb (145.2 kg)   SpO2 97%   BMI 40.00 kg/m    Subjective:   Patient ID: Dana Koch, female    DOB: 07/24/1955, 64 y.o.   MRN: [id number]  HPI: Dana Koch is a 64 y.o. female presenting on 11/20/2019 for No chief complaint on file.   HPI Hypothyroidism recheck Patient is coming in for thyroid recheck today as well. They deny any issues with hair changes or heat or cold problems or diarrhea or constipation. They deny any chest pain or palpitations. They are currently on levothyroxine 200 micrograms, she says she did miss some doses before the last time she was seen and had some sweats and she does not know exactly how many she missed.  Hypertension Patient is currently on furosemide and hydrochlorothiazide, and their blood pressure today is 140/70. Patient denies any lightheadedness or dizziness. Patient denies headaches, blurred vision, chest pains, shortness of breath, or weakness. Denies any side effects from medication and is content with current medication.    Relevant past medical, surgical, family and social history reviewed and updated as indicated. Interim medical history since our last visit reviewed. Allergies and medications reviewed and updated.  Review of Systems  Constitutional: Positive for unexpected weight change (Patient has gained a lot of weight and she does admit that this is because she has been at home and eating more and not active and not getting out, she says she will try to make some adjustments and see if she can change.). Negative for chills and fever.  Eyes: Negative for visual disturbance.  Respiratory: Negative for chest tightness and shortness of breath.   Cardiovascular: Negative for chest pain and leg swelling.  Genitourinary: Negative for difficulty urinating and dysuria.  Musculoskeletal: Negative for back pain and gait problem.  Skin: Negative for rash.   Neurological: Negative for light-headedness and headaches.  Psychiatric/Behavioral: Negative for agitation and behavioral problems.  All other systems reviewed and are negative.   Per HPI unless specifically indicated above   Allergies as of 11/20/2019      Reactions   Demerol [meperidine] Other (See Comments)   Stops my heart   Statins Other (See Comments), Shortness Of Breath   sob   Amoxicillin Hives   Red Yeast Rice [cholestin] Itching      Medication List       Accurate as of November 20, 2019  9:14 AM. If you have any questions, ask your nurse or doctor.        B-12 1000 MCG Caps Take by mouth.   CoQ-10 100 MG Caps   donepezil 10 MG tablet Commonly known as: Aricept Take 1 tablet (10 mg total) by mouth at bedtime. Started by: November 22, 2019, MD   ezetimibe 10 MG tablet Commonly known as: Zetia Take 1 tablet (10 mg total) by mouth daily.   Fish Oil 1000 MG Caps   furosemide 20 MG tablet Commonly known as: LASIX Take 1 tablet (20 mg total) by mouth daily.   hydrochlorothiazide 12.5 MG capsule Commonly known as: MICROZIDE TAKE (1) CAPSULE DAILY   levothyroxine 200 MCG tablet Commonly known as: SYNTHROID TAKE 1 TABLET DAILY        Objective:   BP 140/70   Pulse 70   Temp (!) 97 F (36.1 C)   Ht 6\' 3"  (1.905 m)  Wt (!) 320 lb (145.2 kg)   SpO2 97%   BMI 40.00 kg/m   Wt Readings from Last 3 Encounters:  11/20/19 (!) 320 lb (145.2 kg)  05/20/19 286 lb 2 oz (129.8 kg)  04/09/19 282 lb (127.9 kg)    Physical Exam Vitals and nursing note reviewed.  Constitutional:      General: She is not in acute distress.    Appearance: She is well-developed. She is not diaphoretic.  Eyes:     Conjunctiva/sclera: Conjunctivae normal.  Cardiovascular:     Rate and Rhythm: Normal rate and regular rhythm.     Heart sounds: Normal heart sounds. No murmur heard.   Pulmonary:     Effort: Pulmonary effort is normal. No respiratory distress.     Breath  sounds: Normal breath sounds. No wheezing.  Musculoskeletal:        General: No tenderness. Normal range of motion.  Skin:    General: Skin is warm and dry.     Findings: No rash.  Neurological:     Mental Status: She is alert and oriented to person, place, and time.     Coordination: Coordination normal.  Psychiatric:        Behavior: Behavior normal.       Assessment & Plan:   Problem List Items Addressed This Visit      Cardiovascular and Mediastinum   Essential hypertension     Endocrine   Hypothyroidism - Primary   Relevant Orders   TSH   Ambulatory referral to Endocrinology    Other Visit Diagnoses    Need for immunization against influenza       Relevant Orders   Flu Vaccine QUAD 36+ mos IM (Completed)   Memory problem          Will recheck thyroid today, patient requests referral to endocrinology as well. We will see if we have to adjust her thyroid dose from the labs Follow up plan: Return in about 3 months (around 02/20/2020), or if symptoms worsen or fail to improve, for Hypertension hypothyroidism.  Counseling provided for all of the vaccine components Orders Placed This Encounter  Procedures  . Flu Vaccine QUAD 36+ mos IM  . TSH  . Ambulatory referral to Endocrinology    Arville Care, MD [facility] 11/20/2019, 9:14 AM

## 2019-11-21 LAB — TSH: TSH: 1.23 u[IU]/mL (ref 0.450–4.500)

## 2019-11-23 ENCOUNTER — Encounter: Payer: Self-pay | Admitting: Family Medicine

## 2019-12-21 ENCOUNTER — Encounter: Payer: Self-pay | Admitting: Family Medicine

## 2020-01-25 DIAGNOSIS — E039 Hypothyroidism, unspecified: Secondary | ICD-10-CM | POA: Diagnosis not present

## 2020-02-19 ENCOUNTER — Other Ambulatory Visit: Payer: Self-pay | Admitting: Family Medicine

## 2020-02-19 DIAGNOSIS — I10 Essential (primary) hypertension: Secondary | ICD-10-CM

## 2020-02-22 ENCOUNTER — Ambulatory Visit: Payer: Medicaid Other | Admitting: Family Medicine

## 2020-03-24 ENCOUNTER — Ambulatory Visit: Payer: Medicaid Other | Admitting: Family Medicine

## 2020-03-24 ENCOUNTER — Telehealth: Payer: Self-pay

## 2020-03-24 ENCOUNTER — Encounter: Payer: Self-pay | Admitting: Family Medicine

## 2020-03-24 NOTE — Telephone Encounter (Signed)
I've never seen her, but sure.

## 2020-03-24 NOTE — Telephone Encounter (Signed)
Left message to call back  

## 2020-03-24 NOTE — Telephone Encounter (Signed)
Pt called to cancel her 3 mth check up with Dr Louanne Skye. Says she would like to switch providers. Would like to switch to [facility].  Please confirm switch and call pt to schedule check up with new provider.

## 2020-03-24 NOTE — Telephone Encounter (Signed)
I am okay with switching if [place] is

## 2020-04-07 DIAGNOSIS — Z90710 Acquired absence of both cervix and uterus: Secondary | ICD-10-CM | POA: Diagnosis not present

## 2020-04-07 DIAGNOSIS — Z01411 Encounter for gynecological examination (general) (routine) with abnormal findings: Secondary | ICD-10-CM | POA: Diagnosis not present

## 2020-04-07 DIAGNOSIS — Z124 Encounter for screening for malignant neoplasm of cervix: Secondary | ICD-10-CM | POA: Diagnosis not present

## 2020-04-07 DIAGNOSIS — Z1151 Encounter for screening for human papillomavirus (HPV): Secondary | ICD-10-CM | POA: Diagnosis not present

## 2020-04-07 DIAGNOSIS — Z8542 Personal history of malignant neoplasm of other parts of uterus: Secondary | ICD-10-CM | POA: Diagnosis not present

## 2020-04-07 DIAGNOSIS — Z0000 Encounter for general adult medical examination without abnormal findings: Secondary | ICD-10-CM | POA: Diagnosis not present

## 2020-04-07 DIAGNOSIS — Z87891 Personal history of nicotine dependence: Secondary | ICD-10-CM | POA: Diagnosis not present

## 2020-04-07 DIAGNOSIS — D069 Carcinoma in situ of cervix, unspecified: Secondary | ICD-10-CM | POA: Diagnosis not present

## 2020-04-28 DIAGNOSIS — E039 Hypothyroidism, unspecified: Secondary | ICD-10-CM | POA: Diagnosis not present

## 2020-05-03 ENCOUNTER — Encounter: Payer: Self-pay | Admitting: Family Medicine

## 2020-05-03 ENCOUNTER — Ambulatory Visit (INDEPENDENT_AMBULATORY_CARE_PROVIDER_SITE_OTHER): Payer: Medicaid Other | Admitting: Family Medicine

## 2020-05-03 ENCOUNTER — Other Ambulatory Visit: Payer: Self-pay

## 2020-05-03 VITALS — BP 150/89 | HR 74 | Temp 97.9°F | Ht 75.0 in | Wt 350.8 lb

## 2020-05-03 DIAGNOSIS — E8881 Metabolic syndrome: Secondary | ICD-10-CM

## 2020-05-03 DIAGNOSIS — R7303 Prediabetes: Secondary | ICD-10-CM | POA: Diagnosis not present

## 2020-05-03 DIAGNOSIS — I10 Essential (primary) hypertension: Secondary | ICD-10-CM

## 2020-05-03 DIAGNOSIS — E785 Hyperlipidemia, unspecified: Secondary | ICD-10-CM | POA: Diagnosis not present

## 2020-05-03 DIAGNOSIS — M1990 Unspecified osteoarthritis, unspecified site: Secondary | ICD-10-CM | POA: Diagnosis not present

## 2020-05-03 DIAGNOSIS — I890 Lymphedema, not elsewhere classified: Secondary | ICD-10-CM | POA: Diagnosis not present

## 2020-05-03 MED ORDER — HYDROCHLOROTHIAZIDE 12.5 MG PO CAPS: ORAL_CAPSULE | ORAL | 1 refills | Status: DC

## 2020-05-03 MED ORDER — TRULICITY 0.75 MG/0.5ML ~~LOC~~ SOAJ: 0.7500 mg | SUBCUTANEOUS | 2 refills | Status: DC

## 2020-05-03 MED ORDER — FUROSEMIDE 20 MG PO TABS: 20.0000 mg | ORAL_TABLET | Freq: Every day | ORAL | 1 refills | Status: DC

## 2020-05-03 MED ORDER — EZETIMIBE 10 MG PO TABS: 10.0000 mg | ORAL_TABLET | Freq: Every day | ORAL | 1 refills | Status: DC

## 2020-05-03 NOTE — Progress Notes (Signed)
Assessment & Plan:  1. Prediabetes Diet and exercise encouraged. Rx'd Trulicity.  2. Arthritis Encouraged ibuprofen 600 mg with Tylenol 500 mg 3 times daily.  3. Lymph edema - Ambulatory referral to Vascular Surgery  4. Essential hypertension Encouraged diet and exercise. - furosemide (LASIX) 20 MG tablet; Take 1 tablet (20 mg total) by mouth daily.  Dispense: 90 tablet; Refill: 1 - hydrochlorothiazide (MICROZIDE) 12.5 MG capsule; TAKE (1) CAPSULE DAILY  Dispense: 90 capsule; Refill: 1 - Dulaglutide (TRULICITY) 1.51 VO/1.6WV SOPN; Inject 0.75 mg into the skin once a week.  Dispense: 2 mL; Refill: 2 - CBC with Differential/Platelet; Future - CMP14+EGFR; Future - Lipid panel; Future  5. Morbid obesity ([place]) Encouraged diet and exercise.  Rx'd Trulicity. - Dulaglutide (TRULICITY) 3.71 GG/2.6RS SOPN; Inject 0.75 mg into the skin once a week.  Dispense: 2 mL; Refill: 2 - CBC with Differential/Platelet; Future - CMP14+EGFR; Future - Lipid panel; Future  6. Dyslipidemia, goal LDL below 100 Labs to assess. - ezetimibe (ZETIA) 10 MG tablet; Take 1 tablet (10 mg total) by mouth daily.  Dispense: 90 tablet; Refill: 1 - Dulaglutide (TRULICITY) 8.54 OE/7.0JJ SOPN; Inject 0.75 mg into the skin once a week.  Dispense: 2 mL; Refill: 2 - CMP14+EGFR; Future - Lipid panel; Future  7. Metabolic syndrome Rx'd Trulicity. - Dulaglutide (TRULICITY) 0.09 FG/1.8EX SOPN; Inject 0.75 mg into the skin once a week.  Dispense: 2 mL; Refill: 2   Return in about 6 weeks (around 06/14/2020) for weight.  Hendricks Limes, MSN, APRN, FNP-C [facility]  Subjective:    Patient ID: Dana Koch, female    DOB: [birth year], 65 y.o.   MRN: [id number]  Patient Care Team: Loman [place], FNP as PCP - General (Family Medicine)   Chief Complaint:  Chief Complaint  Patient presents with  . Establish Care  . Prediabetes    Check up of chronic medical conditions     HPI: Dana Koch is a 65 y.o. female presenting on 05/03/2020 for Establish Care and Prediabetes (Check up of chronic medical conditions )  Prediabetes: Patient reports she eats a healthy diet but admits she eats a lot of peppermints when she is in the car.  States the last time she was eating a lot of peppermints her A1c went up and as soon as she stopped eating then it came back down.  She is walking when she can, but reports it is very hard due to her arthritis pains.  Arthritis: Patient is taking ibuprofen 600 mg with Tylenol 500 mg once daily.  She does walk with a cane.  Lymphedema: Patient is requesting a referral to a vein specialist nearby as she is unable to get to the one she was previously seeing in [place].  Hypertension: Patient reports her blood pressure at home is generally 140/70.    Social history:  Relevant past medical, surgical, family and social history reviewed and updated as indicated. Interim medical history since our last visit reviewed.  Allergies and medications reviewed and updated.  DATA REVIEWED: CHART IN EPIC  ROS: Negative unless specifically indicated above in HPI.    Current Outpatient Medications:  .  Cyanocobalamin (B-12) 1000 MCG CAPS, Take by mouth., Disp: , Rfl:  .  ezetimibe (ZETIA) 10 MG tablet, Take 1 tablet (10 mg total) by mouth daily., Disp: 90 tablet, Rfl: 3 .  furosemide (LASIX) 20 MG tablet, Take 1 tablet (20 mg total) by mouth daily., Disp: 90 tablet, Rfl: 0 .  hydrochlorothiazide (MICROZIDE) 12.5 MG capsule, TAKE (1) CAPSULE DAILY, Disp: 90 capsule, Rfl: 0 .  levothyroxine (SYNTHROID) 200 MCG tablet, TAKE 1 TABLET DAILY, Disp: 90 tablet, Rfl: 3 .  Omega-3 Fatty Acids (FISH OIL) 1000 MG CAPS, , Disp: , Rfl:    Allergies  Allergen Reactions  . Demerol [Meperidine] Other (See Comments)    Stops my heart  . Statins Other (See Comments) and Shortness Of Breath    sob  . Amoxicillin Hives  . Red Yeast Rice [Cholestin] Itching   Past Medical  History:  Diagnosis Date  . Abnormal Papanicolaou smear of cervix 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 02/10/2018   [facility] +HPV, will schedule colpo with Dr Glo Herring   . Hyperlipidemia   . Hypertension   . Hypothyroidism   . Lymphedema   . Neuropathy   . Venous stasis     Past Surgical History:  Procedure Laterality Date  . CHOLECYSTECTOMY  1989  . LEG SURGERY Left    broken left leg    Social History   Socioeconomic History  . Marital status: Legally Separated    Spouse name: Not on file  . Number of children: Not on file  . Years of education: Not on file  . Highest education level: Not on file  Occupational History  . Not on file  Tobacco Use  . Smoking status: Former Smoker    Packs/day: 3.00    Years: 40.00    Pack years: 120.00    Types: Cigarettes    Start date: 12/04/1972    Quit date: 01/03/2013    Years since quitting: 7.3  . Smokeless tobacco: Never Used  Vaping Use  . Vaping Use: Never used  Substance and Sexual Activity  . Alcohol use: No    Alcohol/week: 0.0 standard drinks  . Drug use: No  . Sexual activity: Not Currently    Birth control/protection: Post-menopausal  Other Topics Concern  . Not on file  Social History Narrative  . Not on file   Social Determinants of Health   Financial Resource Strain: Not on file  Food Insecurity: Not on file  Transportation Needs: Not on file  Physical Activity: Not on file  Stress: Not on file  Social Connections: Not on file  Intimate Partner Violence: Not on file        Objective:    BP (!) 150/89   Pulse 74   Temp 97.9 F (36.6 C) (Temporal)   Ht _0  (1.905 m)   Wt (!) 350 lb 12.8 oz (159.1 kg)   SpO2 93%   BMI 43.85 kg/m   Wt Readings from Last 3 Encounters:  05/03/20 (!) 350 lb 12.8 oz (159.1 kg)  11/20/19 (!) 320 lb (145.2 kg)  05/20/19 286 lb 2 oz (129.8 kg)    Physical Exam Vitals reviewed.  Constitutional:      General: She is not in acute distress.    Appearance:  Normal appearance. She is morbidly obese. She is not ill-appearing, toxic-appearing or diaphoretic.  HENT:     Head: Normocephalic and atraumatic.  Eyes:     General: No scleral icterus.       Right eye: No discharge.        Left eye: No discharge.     Conjunctiva/sclera: Conjunctivae normal.  Cardiovascular:     Rate and Rhythm: Normal rate and regular rhythm.     Heart sounds: Normal heart sounds. No murmur heard. No friction rub. No gallop.  Pulmonary:     Effort: Pulmonary effort is normal. No respiratory distress.     Breath sounds: Normal breath sounds. No stridor. No wheezing, rhonchi or rales.  Musculoskeletal:        General: Normal range of motion.     Cervical back: Normal range of motion.  Skin:    General: Skin is warm and dry.     Capillary Refill: Capillary refill takes less than 2 seconds.  Neurological:     General: No focal deficit present.     Mental Status: She is alert and oriented to person, place, and time. Mental status is at baseline.     Gait: Gait abnormal (ambulating with cane).  Psychiatric:        Mood and Affect: Mood normal.        Behavior: Behavior normal.        Thought Content: Thought content normal.        Judgment: Judgment normal.     Lab Results  Component Value Date   TSH 1.230 11/20/2019   Lab Results  Component Value Date   WBC 8.3 10/15/2019   HGB 12.0 10/15/2019   HCT 36.9 10/15/2019   MCV 87 10/15/2019   PLT 465 (H) 10/15/2019   Lab Results  Component Value Date   NA 140 10/15/2019   K 3.1 (L) 10/15/2019   CO2 31 (H) 10/15/2019   GLUCOSE 132 (H) 10/15/2019   BUN 9 10/15/2019   CREATININE 0.62 10/15/2019   BILITOT 0.3 10/15/2019   ALKPHOS 93 10/15/2019   AST 26 10/15/2019   ALT 28 10/15/2019   PROT 6.5 10/15/2019   ALBUMIN 3.8 10/15/2019   CALCIUM 9.1 10/15/2019   Lab Results  Component Value Date   CHOL 224 (H) 05/20/2019   Lab Results  Component Value Date   HDL 63 05/20/2019   Lab Results   Component Value Date   LDLCALC 139 (H) 05/20/2019   Lab Results  Component Value Date   TRIG 125 05/20/2019   Lab Results  Component Value Date   CHOLHDL 3.6 05/20/2019   Lab Results  Component Value Date   HGBA1C 6.3 (H) 10/15/2019

## 2020-05-04 ENCOUNTER — Telehealth: Payer: Self-pay

## 2020-05-05 ENCOUNTER — Telehealth: Payer: Self-pay

## 2020-05-05 NOTE — Telephone Encounter (Signed)
Drug- Trulicity 0.75MG /0.5ML pen-injectors   Key: [id number]  PA Case ID: [id number]  Rx #: [id number]  Sent to plan

## 2020-05-05 NOTE — Telephone Encounter (Signed)
Outcome Denied today  PA Case: [id number], Status: Denied. Notification: Completed.  Drug Trulicity 0.75MG /0.5ML pen-injectors  Form [employer] [facility] Electronic [place] Form [phone] NCPDP)

## 2020-05-05 NOTE — Telephone Encounter (Signed)
She was already going to [place] and wanted something closer due to the drive.

## 2020-05-06 ENCOUNTER — Other Ambulatory Visit: Payer: Self-pay

## 2020-05-06 ENCOUNTER — Other Ambulatory Visit: Payer: Medicaid Other

## 2020-05-06 DIAGNOSIS — I10 Essential (primary) hypertension: Secondary | ICD-10-CM | POA: Diagnosis not present

## 2020-05-06 DIAGNOSIS — E785 Hyperlipidemia, unspecified: Secondary | ICD-10-CM

## 2020-05-06 NOTE — Telephone Encounter (Signed)
Did we put her prediabetes, hypertension, hyperlipidemia, metabolic syndrome, and morbid obesity on her PA?

## 2020-05-06 NOTE — Telephone Encounter (Signed)
Please make patient aware of this.

## 2020-05-07 LAB — CMP14+EGFR
ALT: 14 IU/L (ref 0–32)
AST: 20 IU/L (ref 0–40)
Albumin/Globulin Ratio: 1.6 (ref 1.2–2.2)
Albumin: 4.6 g/dL (ref 3.8–4.8)
Alkaline Phosphatase: 115 IU/L (ref 44–121)
BUN/Creatinine Ratio: 20 (ref 12–28)
BUN: 14 mg/dL (ref 8–27)
Bilirubin Total: 0.3 mg/dL (ref 0.0–1.2)
CO2: 26 mmol/L (ref 20–29)
Calcium: 9.6 mg/dL (ref 8.7–10.3)
Chloride: 98 mmol/L (ref 96–106)
Creatinine, Ser: 0.7 mg/dL (ref 0.57–1.00)
Globulin, Total: 2.8 g/dL (ref 1.5–4.5)
Glucose: 100 mg/dL — ABNORMAL HIGH (ref 65–99)
Potassium: 4.2 mmol/L (ref 3.5–5.2)
Sodium: 139 mmol/L (ref 134–144)
Total Protein: 7.4 g/dL (ref 6.0–8.5)
eGFR: 97 mL/min/{1.73_m2} (ref >59)

## 2020-05-07 LAB — CBC WITH DIFFERENTIAL/PLATELET
Basophils Absolute: 0.1 10*3/uL (ref 0.0–0.2)
Basos: 1 %
EOS (ABSOLUTE): 0.3 10*3/uL (ref 0.0–0.4)
Eos: 4 %
Hematocrit: 38.3 % (ref 34.0–46.6)
Hemoglobin: 12.4 g/dL (ref 11.1–15.9)
Immature Grans (Abs): 0.1 10*3/uL (ref 0.0–0.1)
Immature Granulocytes: 1 %
Lymphocytes Absolute: 2.2 10*3/uL (ref 0.7–3.1)
Lymphs: 34 %
MCH: 28.8 pg (ref 26.6–33.0)
MCHC: 32.4 g/dL (ref 31.5–35.7)
MCV: 89 fL (ref 79–97)
Monocytes Absolute: 0.4 10*3/uL (ref 0.1–0.9)
Monocytes: 7 %
Neutrophils Absolute: 3.4 10*3/uL (ref 1.4–7.0)
Neutrophils: 53 %
Platelets: 251 10*3/uL (ref 150–450)
RBC: 4.31 x10E6/uL (ref 3.77–5.28)
RDW: 12.9 % (ref 11.7–15.4)
WBC: 6.4 10*3/uL (ref 3.4–10.8)

## 2020-05-07 LAB — LIPID PANEL
Chol/HDL Ratio: 3.8 ratio (ref 0.0–4.4)
Cholesterol, Total: 210 mg/dL — ABNORMAL HIGH (ref 100–199)
HDL: 55 mg/dL (ref >39)
LDL Chol Calc (NIH): 129 mg/dL — ABNORMAL HIGH (ref 0–99)
Triglycerides: 146 mg/dL (ref 0–149)
VLDL Cholesterol Cal: 26 mg/dL (ref 5–40)

## 2020-05-09 NOTE — Telephone Encounter (Signed)
No, please just let the patient know we could not get the medication approved.

## 2020-05-09 NOTE — Telephone Encounter (Signed)
I went back and looked on cover my meds and I could not see the dx used. I always use what you associate medication with.  Would you like me to try to re do the PA to see if outcome is different.

## 2020-05-10 ENCOUNTER — Telehealth: Payer: Self-pay

## 2020-05-10 NOTE — Telephone Encounter (Signed)
Patient aware and verbalizes understanding. 

## 2020-05-10 NOTE — Telephone Encounter (Signed)
Please call back use REF # [id number] about trulicity PA

## 2020-05-10 NOTE — Telephone Encounter (Signed)
Patient aware that Trulicity will not be covered.

## 2020-05-24 ENCOUNTER — Encounter: Payer: Self-pay | Admitting: Family Medicine

## 2020-05-24 DIAGNOSIS — I890 Lymphedema, not elsewhere classified: Secondary | ICD-10-CM

## 2020-06-14 ENCOUNTER — Ambulatory Visit: Payer: Medicaid Other | Admitting: Family Medicine

## 2020-06-14 ENCOUNTER — Other Ambulatory Visit: Payer: Self-pay

## 2020-06-14 ENCOUNTER — Encounter: Payer: Self-pay | Admitting: Family Medicine

## 2020-06-14 DIAGNOSIS — Z9189 Other specified personal risk factors, not elsewhere classified: Secondary | ICD-10-CM

## 2020-06-14 DIAGNOSIS — Z5982 Transportation insecurity: Secondary | ICD-10-CM

## 2020-06-14 NOTE — Progress Notes (Signed)
Assessment & Plan:  1. Morbid obesity ([place]) Insurance will not cover GLP to help with weight loss. Encouraged diet and exercise.   2. Lack of access to transportation Offered referral to [facility] to help with transportation needs but patient declined. She will call her insurance company back.    Return in about 6 months (around 12/14/2020) for annual physical.  Hendricks Limes, MSN, APRN, FNP-C [facility]  Subjective:    Patient ID: Dana Koch, female    DOB: 23-Jun-1955, 65 y.o.   MRN: [id number]  Patient Care Team: Loman Brooklyn, FNP as PCP - General (Family Medicine)   Chief Complaint:  Chief Complaint  Patient presents with  . Weight Check    6 week re check     HPI: Dana Koch is a 65 y.o. female presenting on 06/14/2020 for Weight Check (6 week re check )  Patient is here to follow-up on her weight.  She was prescribed Trulicity at our last visit which her insurance would not cover.  New complaints: Patient is having a hard time finding transportation to and from her appointments.  Social history:  Relevant past medical, surgical, family and social history reviewed and updated as indicated. Interim medical history since our last visit reviewed.  Allergies and medications reviewed and updated.  DATA REVIEWED: CHART IN EPIC  ROS: Negative unless specifically indicated above in HPI.    Current Outpatient Medications:  .  Cyanocobalamin (B-12) 1000 MCG CAPS, Take by mouth., Disp: , Rfl:  .  ezetimibe (ZETIA) 10 MG tablet, Take 1 tablet (10 mg total) by mouth daily., Disp: 90 tablet, Rfl: 1 .  furosemide (LASIX) 20 MG tablet, Take 1 tablet (20 mg total) by mouth daily., Disp: 90 tablet, Rfl: 1 .  hydrochlorothiazide (MICROZIDE) 12.5 MG capsule, TAKE (1) CAPSULE DAILY, Disp: 90 capsule, Rfl: 1 .  levothyroxine (SYNTHROID) 200 MCG tablet, TAKE 1 TABLET DAILY, Disp: 90 tablet, Rfl: 3 .  Omega-3 Fatty Acids (FISH OIL) 1000 MG CAPS, , Disp: ,  Rfl:    Allergies  Allergen Reactions  . Demerol [Meperidine] Other (See Comments)    Stops my heart  . Statins Other (See Comments) and Shortness Of Breath    sob  . Amoxicillin Hives  . Red Yeast Rice [Cholestin] Itching   Past Medical History:  Diagnosis Date  . Abnormal Papanicolaou smear of cervix 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 02/10/2018   [facility] +HPV, will schedule colpo with Dr Glo Herring   . Hyperlipidemia   . Hypertension   . Hypothyroidism   . Lymphedema   . Neuropathy   . Prediabetes   . Venous stasis     Past Surgical History:  Procedure Laterality Date  . CHOLECYSTECTOMY  1989  . LEG SURGERY Left    broken left leg    Social History   Socioeconomic History  . Marital status: Legally Separated    Spouse name: Not on file  . Number of children: Not on file  . Years of education: Not on file  . Highest education level: Not on file  Occupational History  . Not on file  Tobacco Use  . Smoking status: Former Smoker    Packs/day: 3.00    Years: 40.00    Pack years: 120.00    Types: Cigarettes    Start date: 12/04/1972    Quit date: 01/03/2013    Years since quitting: 7.4  . Smokeless tobacco: Never Used  Vaping Use  . Vaping  Use: Never used  Substance and Sexual Activity  . Alcohol use: No    Alcohol/week: 0.0 standard drinks  . Drug use: No  . Sexual activity: Not Currently    Birth control/protection: Post-menopausal  Other Topics Concern  . Not on file  Social History Narrative  . Not on file   Social Determinants of Health   Financial Resource Strain: Not on file  Food Insecurity: Not on file  Transportation Needs: Not on file  Physical Activity: Not on file  Stress: Not on file  Social Connections: Not on file  Intimate Partner Violence: Not on file        Objective:    BP 140/83   Pulse 77   Temp (!) 97.5 F (36.4 C) (Temporal)   Ht 6' 3"  (1.905 m)   Wt (!) 348 lb 3.2 oz (157.9 kg)   SpO2 95%   BMI 43.52 kg/m    Wt Readings from Last 3 Encounters:  06/14/20 (!) 348 lb 3.2 oz (157.9 kg)  05/03/20 (!) 350 lb 12.8 oz (159.1 kg)  11/20/19 (!) 320 lb (145.2 kg)    Physical Exam Vitals reviewed.  Constitutional:      General: She is not in acute distress.    Appearance: Normal appearance. She is morbidly obese. She is not ill-appearing, toxic-appearing or diaphoretic.  HENT:     Head: Normocephalic and atraumatic.  Eyes:     General: No scleral icterus.       Right eye: No discharge.        Left eye: No discharge.     Conjunctiva/sclera: Conjunctivae normal.  Cardiovascular:     Rate and Rhythm: Normal rate.  Pulmonary:     Effort: Pulmonary effort is normal. No respiratory distress.  Musculoskeletal:        General: Normal range of motion.     Cervical back: Normal range of motion.  Skin:    General: Skin is warm and dry.     Capillary Refill: Capillary refill takes less than 2 seconds.  Neurological:     General: No focal deficit present.     Mental Status: She is alert and oriented to person, place, and time. Mental status is at baseline.     Gait: Gait abnormal (ambulates with cane).  Psychiatric:        Mood and Affect: Mood normal.        Behavior: Behavior normal.        Thought Content: Thought content normal.        Judgment: Judgment normal.     Lab Results  Component Value Date   TSH 1.230 11/20/2019   Lab Results  Component Value Date   WBC 6.4 05/06/2020   HGB 12.4 05/06/2020   HCT 38.3 05/06/2020   MCV 89 05/06/2020   PLT 251 05/06/2020   Lab Results  Component Value Date   NA 139 05/06/2020   K 4.2 05/06/2020   CO2 26 05/06/2020   GLUCOSE 100 (H) 05/06/2020   BUN 14 05/06/2020   CREATININE 0.70 05/06/2020   BILITOT 0.3 05/06/2020   ALKPHOS 115 05/06/2020   AST 20 05/06/2020   ALT 14 05/06/2020   PROT 7.4 05/06/2020   ALBUMIN 4.6 05/06/2020   CALCIUM 9.6 05/06/2020   EGFR 97 05/06/2020   Lab Results  Component Value Date   CHOL 210 (H)  05/06/2020   Lab Results  Component Value Date   HDL 55 05/06/2020   Lab Results  Component Value  Date   LDLCALC 129 (H) 05/06/2020   Lab Results  Component Value Date   TRIG 146 05/06/2020   Lab Results  Component Value Date   CHOLHDL 3.8 05/06/2020   Lab Results  Component Value Date   HGBA1C 6.3 (H) 10/15/2019

## 2020-06-22 ENCOUNTER — Encounter (INDEPENDENT_AMBULATORY_CARE_PROVIDER_SITE_OTHER): Payer: Medicaid Other | Admitting: Nurse Practitioner

## 2020-06-30 ENCOUNTER — Ambulatory Visit (INDEPENDENT_AMBULATORY_CARE_PROVIDER_SITE_OTHER): Payer: Medicaid Other | Admitting: Nurse Practitioner

## 2020-06-30 ENCOUNTER — Other Ambulatory Visit: Payer: Self-pay

## 2020-06-30 VITALS — BP 143/83 | HR 72 | Ht 75.0 in | Wt 350.0 lb

## 2020-06-30 DIAGNOSIS — I10 Essential (primary) hypertension: Secondary | ICD-10-CM | POA: Diagnosis not present

## 2020-06-30 DIAGNOSIS — I890 Lymphedema, not elsewhere classified: Secondary | ICD-10-CM

## 2020-06-30 DIAGNOSIS — E785 Hyperlipidemia, unspecified: Secondary | ICD-10-CM | POA: Diagnosis not present

## 2020-07-05 ENCOUNTER — Encounter (INDEPENDENT_AMBULATORY_CARE_PROVIDER_SITE_OTHER): Payer: Self-pay | Admitting: Nurse Practitioner

## 2020-07-05 NOTE — Progress Notes (Signed)
Subjective:    Patient ID: Dana Koch, female    DOB: Apr 27, 1955, 65 y.o.   MRN: [id number] Chief Complaint  Patient presents with   New Patient (Initial Visit)    Dr. Tresa Moore Dx lymph edema     Dana Koch is seen for evaluation of leg swelling.  She has previously been seen by our office however its been greater than 3 years ago.  Since that time she has maintained conservative therapy.  She utilizes compression socks daily with elevation and activity as possible.  She had been using a lymphedema pump however this lymphedema pump was on loan.  The pump had been a very positive factor in her care and she would like to obtain her own.   The patient first noticed the swelling remotely but is now concerned because of a significant increase in the overall edema. The swelling is associated with pain and discoloration. The patient notes that in the morning the legs are significantly improved but they steadily worsened throughout the course of the day. Elevation makes the legs better, dependency makes them much worse.    The patient denies a history of DVT or PE. There is no prior history of phlebitis. There is no history of primary lymphedema.    Review of Systems  Cardiovascular:  Positive for leg swelling.  All other systems reviewed and are negative.     Objective:   Physical Exam Vitals reviewed.  HENT:     Head: Normocephalic.  Cardiovascular:     Rate and Rhythm: Normal rate.  Pulmonary:     Effort: Pulmonary effort is normal.  Musculoskeletal:     Right lower leg: 2+ Edema present.     Left lower leg: 2+ Edema present.  Skin:    Comments: Bilateral dermal thickening with stasis dermatitis  Neurological:     Mental Status: She is alert and oriented to person, place, and time.     Motor: Weakness present.  Psychiatric:        Mood and Affect: Mood normal.        Behavior: Behavior normal.        Thought Content: Thought content normal.        Judgment:  Judgment normal.    BP (!) 143/83   Pulse 72   Ht 6\' 3"  (1.905 m)   Wt (!) 350 lb (158.8 kg)   BMI 43.75 kg/m   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 02/10/2018   [facility] +HPV, will schedule colpo with Dr [facility]    Hyperlipidemia    Hypertension    Hypothyroidism    Lymphedema    Neuropathy    Prediabetes    Venous stasis     Social History   Socioeconomic History   Marital status: Legally Separated    Spouse name: Not on file   Number of children: Not on file   Years of education: Not on file   Highest education level: Not on file  Occupational History   Not on file  Tobacco Use   Smoking status: Former    Packs/day: 3.00    Years: 40.00    Pack years: 120.00    Types: Cigarettes    Start date: 12/04/1972    Quit date: 01/03/2013    Years since quitting: 7.5   Smokeless tobacco: Never  Vaping Use   Vaping Use: Never used  Substance and Sexual Activity  Alcohol use: No    Alcohol/week: 0.0 standard drinks   Drug use: No   Sexual activity: Not Currently    Birth control/protection: Post-menopausal  Other Topics Concern   Not on file  Social History Narrative   Not on file   Social Determinants of Health   Financial Resource Strain: Not on file  Food Insecurity: Not on file  Transportation Needs: Not on file  Physical Activity: Not on file  Stress: Not on file  Social Connections: Not on file  Intimate Partner Violence: Not on file    Past Surgical History:  Procedure Laterality Date   CHOLECYSTECTOMY  1989   LEG SURGERY Left    broken left leg    Family History  Problem Relation Age of Onset   Thyroid disease Mother    Bone cancer Mother    Thyroid disease Father    Emphysema Father    Diabetes Father    Neuropathy Father    Diabetes Brother    Thyroid disease Brother    Thyroid disease Sister     Allergies  Allergen Reactions   Demerol  [Meperidine] Other (See Comments)    Stops my heart   Statins Other (See Comments) and Shortness Of Breath    sob   Amoxicillin Hives   Red Yeast Rice [Cholestin] Itching    CBC Latest Ref Rng & Units 05/06/2020 10/15/2019 05/20/2019  WBC 3.4 - 10.8 x10E3/uL 6.4 8.3 5.8  Hemoglobin 11.1 - 15.9 g/dL 35.7 01.7 79.3  Hematocrit 34.0 - 46.6 % 38.3 36.9 42.3  Platelets 150 - 450 x10E3/uL 251 465(H) 230      CMP     Component Value Date/Time   NA 139 05/06/2020 0900   K 4.2 05/06/2020 0900   CL 98 05/06/2020 0900   CO2 26 05/06/2020 0900   GLUCOSE 100 (H) 05/06/2020 0900   BUN 14 05/06/2020 0900   CREATININE 0.70 05/06/2020 0900   CALCIUM 9.6 05/06/2020 0900   PROT 7.4 05/06/2020 0900   ALBUMIN 4.6 05/06/2020 0900   AST 20 05/06/2020 0900   ALT 14 05/06/2020 0900   ALKPHOS 115 05/06/2020 0900   BILITOT 0.3 05/06/2020 0900   GFRNONAA 97 10/15/2019 1414   GFRAA 112 10/15/2019 1414     No results found.     Assessment & Plan:   1. Lymph edema Recommend:  No surgery or intervention at this point in time.    I have reviewed my previous discussion with the patient regarding swelling and why it causes symptoms.  Patient will continue wearing graduated compression stockings class 1 (20-30 mmHg) on a daily basis. The patient will  beginning wearing the stockings first thing in the morning and removing them in the evening. The patient is instructed specifically not to sleep in the stockings.    In addition, behavioral modification including several periods of elevation of the lower extremities during the day will be continued.  This was reviewed with the patient during the initial visit.  The patient will also continue routine exercise, especially walking on a daily basis as was discussed during the initial visit.    Despite conservative treatments of well over 4 weeks including graduated compression therapy class 1 and behavioral modification including exercise and elevation the  patient  has not obtained adequate control of the lymphedema.  The patient still has stage 3 lymphedema and therefore, I believe that a lymph pump should be added to improve the control of the patient's lymphedema.  Additionally,  a lymph pump is warranted because it will reduce the risk of cellulitis and ulceration in the future.  Patient should follow-up in six months    2. Dyslipidemia, goal LDL below 100 Continue statin as ordered and reviewed, no changes at this time   3. Essential hypertension Continue antihypertensive medications as already ordered, these medications have been reviewed and there are no changes at this time.    Current Outpatient Medications on File Prior to Visit  Medication Sig Dispense Refill   Cyanocobalamin (B-12) 1000 MCG CAPS Take by mouth.     ezetimibe (ZETIA) 10 MG tablet Take 1 tablet (10 mg total) by mouth daily. 90 tablet 1   furosemide (LASIX) 20 MG tablet Take 1 tablet (20 mg total) by mouth daily. 90 tablet 1   hydrochlorothiazide (MICROZIDE) 12.5 MG capsule TAKE (1) CAPSULE DAILY 90 capsule 1   levothyroxine (SYNTHROID) 200 MCG tablet TAKE 1 TABLET DAILY 90 tablet 3   liothyronine (CYTOMEL) 5 MCG tablet Take 5 mcg by mouth daily.     Omega-3 Fatty Acids (FISH OIL) 1000 MG CAPS      No current facility-administered medications on file prior to visit.    There are no Patient Instructions on file for this visit. No follow-ups on file.   Georgiana Spinner, NP

## 2020-07-15 ENCOUNTER — Telehealth (INDEPENDENT_AMBULATORY_CARE_PROVIDER_SITE_OTHER): Payer: Self-pay

## 2020-07-15 NOTE — Telephone Encounter (Signed)
error 

## 2020-07-26 DIAGNOSIS — I890 Lymphedema, not elsewhere classified: Secondary | ICD-10-CM | POA: Diagnosis not present

## 2020-11-17 DIAGNOSIS — L97509 Non-pressure chronic ulcer of other part of unspecified foot with unspecified severity: Secondary | ICD-10-CM | POA: Insufficient documentation

## 2020-11-17 HISTORY — DX: Non-pressure chronic ulcer of other part of unspecified foot with unspecified severity: L97.509

## 2020-11-18 ENCOUNTER — Other Ambulatory Visit: Payer: Self-pay

## 2020-11-18 ENCOUNTER — Ambulatory Visit: Payer: Medicaid Other | Admitting: Family Medicine

## 2020-11-18 ENCOUNTER — Encounter: Payer: Self-pay | Admitting: Family Medicine

## 2020-11-18 VITALS — BP 146/81 | HR 79 | Temp 97.8°F | Ht 75.0 in | Wt 357.4 lb

## 2020-11-18 DIAGNOSIS — S91301A Unspecified open wound, right foot, initial encounter: Secondary | ICD-10-CM | POA: Diagnosis not present

## 2020-11-18 MED ORDER — MEPILEX BORDER FLEX LITE EX PADS: 1.0000 | MEDICATED_PAD | CUTANEOUS | 0 refills | Status: DC

## 2020-11-18 NOTE — Progress Notes (Signed)
Assessment & Plan:  1. Open wound of right heel, initial encounter Wound care provided in the office today. Completed and recommended cleansing with normal saline or mild soap and water, pat dry, and cover with mepilex dressing. Change dressing every 3 days or sooner if dressing is heavily soiled. Discussed s/s of infection and encouraged patient to return if she experiences any of these. Otherwise, will see her at her scheduled appointment later this month. - Wound Dressings (MEPILEX BORDER FLEX LITE) PADS; Apply 1 each topically every 3 (three) days.  Dispense: 5 each; Refill: 0   Follow up plan: Return if symptoms worsen or fail to improve.  Floy Sabina, NP Student I personally was present during the history, physical exam, and medical decision-making activities of this service and have verified that the service and findings are accurately documented in the nurse practitioner student's note.  Deliah Boston, MSN, APRN, FNP-C [facility]  Subjective:   Patient ID: Dana Koch, female    DOB: [birth year], 65 y.o.   MRN: [id number]  HPI: Dana Koch is a 65 y.o. female presenting on 11/18/2020 for split in heel (X 2 days ago right heel and painful )   She states several months ago she scalded her foot in her shower and did not realize she burned the bottom of her foot for several minutes due to her neuropathy. She noticed a crack in the bottom of her heel a few days ago and it has caused burn-like pain, which is why she is relating it to the scalding event. She states she has been keeping it clean with neosporin, colloidal silver, and gauze wraps. She notices pink drainage on her socks. She is concerned about it healing since she walks on it.    ROS: Negative unless specifically indicated above in HPI.   Relevant past medical history reviewed and updated as indicated.   Allergies and medications reviewed and updated.   Current Outpatient Medications:     Cyanocobalamin (B-12) 1000 MCG CAPS, Take by mouth., Disp: , Rfl:    ezetimibe (ZETIA) 10 MG tablet, Take 1 tablet (10 mg total) by mouth daily., Disp: 90 tablet, Rfl: 1   furosemide (LASIX) 20 MG tablet, Take 1 tablet (20 mg total) by mouth daily., Disp: 90 tablet, Rfl: 1   hydrochlorothiazide (MICROZIDE) 12.5 MG capsule, TAKE (1) CAPSULE DAILY, Disp: 90 capsule, Rfl: 1   levothyroxine (SYNTHROID) 200 MCG tablet, TAKE 1 TABLET DAILY, Disp: 90 tablet, Rfl: 3   liothyronine (CYTOMEL) 5 MCG tablet, Take 5 mcg by mouth daily., Disp: , Rfl:    Omega-3 Fatty Acids (FISH OIL) 1000 MG CAPS, , Disp: , Rfl:   Allergies  Allergen Reactions   Demerol [Meperidine] Other (See Comments)    Stops my heart   Statins Other (See Comments) and Shortness Of Breath    sob   Amoxicillin Hives   Red Yeast Rice [Cholestin] Itching    Objective:   BP (!) 146/81   Pulse 79   Temp 97.8 F (36.6 C) (Temporal)   Ht 6\' 3"  (1.905 m)   Wt (!) 162.1 kg   SpO2 93%   BMI 44.67 kg/m    Physical Exam Vitals reviewed.  Constitutional:      General: She is not in acute distress.    Appearance: Normal appearance. She is normal weight. She is not ill-appearing, toxic-appearing or diaphoretic.  HENT:     Head: Normocephalic and atraumatic.     Nose: Nose normal.  Mouth/Throat:     Mouth: Mucous membranes are moist.     Pharynx: Oropharynx is clear.  Eyes:     Extraocular Movements: Extraocular movements intact.     Conjunctiva/sclera: Conjunctivae normal.     Pupils: Pupils are equal, round, and reactive to light.  Pulmonary:     Effort: Pulmonary effort is normal. No respiratory distress.  Abdominal:     General: There is no distension.     Palpations: Abdomen is soft. There is no mass.  Musculoskeletal:        General: Normal range of motion.     Cervical back: Normal range of motion.  Skin:    General: Skin is warm and dry.     Findings: Laceration present.     Comments: 22x3 mm right lateral  heel  Neurological:     General: No focal deficit present.     Mental Status: She is alert and oriented to person, place, and time.     Motor: No weakness.     Gait: Gait normal.  Psychiatric:        Mood and Affect: Mood normal.        Behavior: Behavior normal.        Thought Content: Thought content normal.        Judgment: Judgment normal.

## 2020-11-18 NOTE — Patient Instructions (Signed)
  [employer]ee[employer]'s Foot Cream   [facility]

## 2020-11-23 DIAGNOSIS — I10 Essential (primary) hypertension: Secondary | ICD-10-CM | POA: Diagnosis not present

## 2020-11-23 DIAGNOSIS — E039 Hypothyroidism, unspecified: Secondary | ICD-10-CM | POA: Diagnosis not present

## 2020-11-23 DIAGNOSIS — R7303 Prediabetes: Secondary | ICD-10-CM | POA: Diagnosis not present

## 2020-11-24 ENCOUNTER — Other Ambulatory Visit: Payer: Self-pay | Admitting: Family Medicine

## 2020-11-24 ENCOUNTER — Encounter: Payer: Self-pay | Admitting: Family Medicine

## 2020-11-24 DIAGNOSIS — E785 Hyperlipidemia, unspecified: Secondary | ICD-10-CM

## 2020-11-24 DIAGNOSIS — I10 Essential (primary) hypertension: Secondary | ICD-10-CM

## 2020-12-14 ENCOUNTER — Ambulatory Visit: Payer: Medicaid Other | Admitting: Family Medicine

## 2020-12-14 ENCOUNTER — Encounter: Payer: Self-pay | Admitting: Family Medicine

## 2020-12-14 VITALS — BP 135/78 | HR 83 | Temp 97.7°F | Ht 75.0 in | Wt 354.0 lb

## 2020-12-14 DIAGNOSIS — S91301D Unspecified open wound, right foot, subsequent encounter: Secondary | ICD-10-CM | POA: Diagnosis not present

## 2020-12-14 DIAGNOSIS — E785 Hyperlipidemia, unspecified: Secondary | ICD-10-CM | POA: Diagnosis not present

## 2020-12-14 DIAGNOSIS — I10 Essential (primary) hypertension: Secondary | ICD-10-CM | POA: Diagnosis not present

## 2020-12-14 MED ORDER — HYDROCHLOROTHIAZIDE 25 MG PO TABS: 25.0000 mg | ORAL_TABLET | Freq: Every day | ORAL | 1 refills | Status: DC

## 2020-12-14 MED ORDER — FUROSEMIDE 20 MG PO TABS: 20.0000 mg | ORAL_TABLET | Freq: Every day | ORAL | 1 refills | Status: DC

## 2020-12-14 NOTE — Patient Instructions (Signed)
Please get levothyroxine refills from your endocrinologist since he is managing your hypothyroidism.

## 2020-12-14 NOTE — Progress Notes (Signed)
Assessment & Plan:  1. Essential hypertension Uncontrolled. Increase HCTZ to 25 mg daily. Education provided on the DASH diet. - encouraged low-salt diet and exercise - furosemide (LASIX) 20 MG tablet; Take 1 tablet (20 mg total) by mouth daily.  Dispense: 90 tablet; Refill: 1 - CBC with Differential/Platelet - CMP14+EGFR - Lipid panel - hydrochlorothiazide (HYDRODIURIL) 25 MG tablet; Take 1 tablet (25 mg total) by mouth daily.  Dispense: 90 tablet; Refill: 1  2. Dyslipidemia, goal LDL below 100 - continue Zetia and fish oil - CMP14+EGFR - Lipid panel  3. Morbid obesity ([place]) - encouraged diet and exercise - continue Ozempic per endocrinology - CBC with Differential/Platelet - CMP14+EGFR - Lipid panel  4. Open wound of right heel, subsequent encounter - Ambulatory referral to Podiatry - encouraged avoidance of pressure on the injury, elevate often   Return in about 6 months (around 06/13/2021) for annual physical.  Lucile Crater, NP Student  I personally was present during the history, physical exam, and medical decision-making activities of this service and have verified that the service and findings are accurately documented in the nurse practitioner student's note.  Hendricks Limes, MSN, APRN, FNP-C [facility]   Subjective:    Patient ID: Dana Koch, female    DOB: 1955/10/03, 65 y.o.   MRN: [id number]  Patient Care Team: Loman Brooklyn, FNP as PCP - General (Family Medicine)   Chief Complaint:  Chief Complaint  Patient presents with   Hypertension   Prediabetes    6 month follow up of chronic medical conditions     HPI: Dana Koch is a 65 y.o. female presenting on 12/14/2020 for Hypertension and Prediabetes (6 month follow up of chronic medical conditions )  Hypertension: She takes HCTZ 12.5 mg and lasix 20 mg daily. She does not check her blood pressure at home because she cannot find her cuff.   Dyslipidemia: She is taking  fish oil and Zetia 10 mg daily.   Heel wound: On 11/4 she was seen for a heel injury that occurred while she was in the shower. She stated it occurred due to not being able to feel the temperature of the water due to her neuropathy. She states today that she is still experiencing pain and cannot tell if it is getting better. She was advised at her last visit not to use neosporin on the site due to moisture. She states she has not used neosporin and has been using menstrual pads and coban dressings.   Prediabetes: recently initiated on Ozempic 0.25 mg weekly by her endocrinologist, Dr. Tamala Julian. A1C on 11/23/20 5.7. She has lost 3 lbs. since her last visit.  New complaints: None  Social history:  Relevant past medical, surgical, family and social history reviewed and updated as indicated. Interim medical history since our last visit reviewed.  Allergies and medications reviewed and updated.  DATA REVIEWED: CHART IN EPIC  ROS: Negative unless specifically indicated above in HPI.    Current Outpatient Medications:    Cyanocobalamin (B-12) 1000 MCG CAPS, Take by mouth., Disp: , Rfl:    ezetimibe (ZETIA) 10 MG tablet, TAKE ONE TABLET ONCE DAILY, Disp: 90 tablet, Rfl: 0   hydrochlorothiazide (HYDRODIURIL) 25 MG tablet, Take 1 tablet (25 mg total) by mouth daily., Disp: 90 tablet, Rfl: 1   levothyroxine (SYNTHROID) 200 MCG tablet, TAKE 1 TABLET DAILY, Disp: 90 tablet, Rfl: 3   liothyronine (CYTOMEL) 5 MCG tablet, Take 5 mcg by mouth daily., Disp: , Rfl:  Omega-3 Fatty Acids (FISH OIL) 1000 MG CAPS, , Disp: , Rfl:    Semaglutide (OZEMPIC, 0.25 OR 0.5 MG/DOSE, ), Inject into the skin., Disp: , Rfl:    furosemide (LASIX) 20 MG tablet, Take 1 tablet (20 mg total) by mouth daily., Disp: 90 tablet, Rfl: 1   Allergies  Allergen Reactions   Demerol [Meperidine] Other (See Comments)    Stops my heart   Statins Other (See Comments) and Shortness Of Breath    sob   Amoxicillin Hives   Red Yeast  Rice [Cholestin] Itching   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 02/10/2018   [facility] +HPV, will schedule colpo with Dr Glo Herring    Hyperlipidemia    Hypertension    Hypothyroidism    Lymphedema    Neuropathy    Prediabetes    Venous stasis     Past Surgical History:  Procedure Laterality Date   CHOLECYSTECTOMY  1989   LEG SURGERY Left    broken left leg    Social History   Socioeconomic History   Marital status: Legally Separated    Spouse name: Not on file   Number of children: Not on file   Years of education: Not on file   Highest education level: Not on file  Occupational History   Not on file  Tobacco Use   Smoking status: Former    Packs/day: 3.00    Years: 40.00    Pack years: 120.00    Types: Cigarettes    Start date: 12/04/1972    Quit date: 01/03/2013    Years since quitting: 7.9   Smokeless tobacco: Never  Vaping Use   Vaping Use: Never used  Substance and Sexual Activity   Alcohol use: No    Alcohol/week: 0.0 standard drinks   Drug use: No   Sexual activity: Not Currently    Birth control/protection: Post-menopausal  Other Topics Concern   Not on file  Social History Narrative   Not on file   Social Determinants of Health   Financial Resource Strain: Not on file  Food Insecurity: Not on file  Transportation Needs: Not on file  Physical Activity: Not on file  Stress: Not on file  Social Connections: Not on file  Intimate Partner Violence: Not on file        Objective:    BP 135/78   Pulse 83   Temp 97.7 F (36.5 C)   Ht 6' 3"  (1.905 m)   Wt (!) 160.6 kg   SpO2 92%   BMI 44.25 kg/m   Wt Readings from Last 3 Encounters:  12/14/20 (!) 354 lb (160.6 kg)  11/18/20 (!) 357 lb 6.4 oz (162.1 kg)  06/30/20 (!) 350 lb (158.8 kg)    Physical Exam Vitals reviewed.  Constitutional:      General: She is not in acute distress.    Appearance: Normal  appearance. She is morbidly obese. She is not ill-appearing, toxic-appearing or diaphoretic.  HENT:     Head: Normocephalic and atraumatic.     Nose: Nose normal.     Mouth/Throat:     Mouth: Mucous membranes are moist.     Pharynx: Oropharynx is clear.  Eyes:     Extraocular Movements: Extraocular movements intact.     Conjunctiva/sclera: Conjunctivae normal.     Pupils: Pupils are equal, round, and reactive to light.  Cardiovascular:     Rate and Rhythm: Normal  rate and regular rhythm.     Pulses: Normal pulses.     Heart sounds: Normal heart sounds.  Pulmonary:     Effort: Pulmonary effort is normal. No respiratory distress.  Abdominal:     General: There is no distension.     Palpations: Abdomen is soft. There is no mass.  Musculoskeletal:        General: Normal range of motion.     Cervical back: Normal range of motion.     Right lower leg: 3+ Pitting Edema present.     Left lower leg: 3+ Pitting Edema present.  Skin:    General: Skin is warm and dry.     Findings: Wound (right heel, 18 mm x 4 mm, tunnelled 5 mm) present.  Neurological:     General: No focal deficit present.     Mental Status: She is alert and oriented to person, place, and time.     Motor: No weakness.     Gait: Gait normal.  Psychiatric:        Mood and Affect: Mood normal.        Behavior: Behavior normal.        Thought Content: Thought content normal.        Judgment: Judgment normal.    Lab Results  Component Value Date   TSH 1.230 11/20/2019   Lab Results  Component Value Date   WBC 6.4 05/06/2020   HGB 12.4 05/06/2020   HCT 38.3 05/06/2020   MCV 89 05/06/2020   PLT 251 05/06/2020   Lab Results  Component Value Date   NA 139 05/06/2020   K 4.2 05/06/2020   CO2 26 05/06/2020   GLUCOSE 100 (H) 05/06/2020   BUN 14 05/06/2020   CREATININE 0.70 05/06/2020   BILITOT 0.3 05/06/2020   ALKPHOS 115 05/06/2020   AST 20 05/06/2020   ALT 14 05/06/2020   PROT 7.4 05/06/2020   ALBUMIN 4.6  05/06/2020   CALCIUM 9.6 05/06/2020   EGFR 97 05/06/2020   Lab Results  Component Value Date   CHOL 210 (H) 05/06/2020   Lab Results  Component Value Date   HDL 55 05/06/2020   Lab Results  Component Value Date   LDLCALC 129 (H) 05/06/2020   Lab Results  Component Value Date   TRIG 146 05/06/2020   Lab Results  Component Value Date   CHOLHDL 3.8 05/06/2020   Lab Results  Component Value Date   HGBA1C 6.3 (H) 10/15/2019

## 2020-12-15 ENCOUNTER — Telehealth: Payer: Self-pay | Admitting: Family Medicine

## 2020-12-15 DIAGNOSIS — E785 Hyperlipidemia, unspecified: Secondary | ICD-10-CM

## 2020-12-15 LAB — LIPID PANEL
Chol/HDL Ratio: 3.9 ratio (ref 0.0–4.4)
Cholesterol, Total: 202 mg/dL — ABNORMAL HIGH (ref 100–199)
HDL: 52 mg/dL (ref >39)
LDL Chol Calc (NIH): 124 mg/dL — ABNORMAL HIGH (ref 0–99)
Triglycerides: 147 mg/dL (ref 0–149)
VLDL Cholesterol Cal: 26 mg/dL (ref 5–40)

## 2020-12-15 LAB — CBC WITH DIFFERENTIAL/PLATELET
Basophils Absolute: 0.1 10*3/uL (ref 0.0–0.2)
Basos: 1 %
EOS (ABSOLUTE): 0.2 10*3/uL (ref 0.0–0.4)
Eos: 3 %
Hematocrit: 36.9 % (ref 34.0–46.6)
Hemoglobin: 12.7 g/dL (ref 11.1–15.9)
Immature Grans (Abs): 0 10*3/uL (ref 0.0–0.1)
Immature Granulocytes: 1 %
Lymphocytes Absolute: 1.9 10*3/uL (ref 0.7–3.1)
Lymphs: 30 %
MCH: 29.6 pg (ref 26.6–33.0)
MCHC: 34.4 g/dL (ref 31.5–35.7)
MCV: 86 fL (ref 79–97)
Monocytes Absolute: 0.4 10*3/uL (ref 0.1–0.9)
Monocytes: 7 %
Neutrophils Absolute: 3.5 10*3/uL (ref 1.4–7.0)
Neutrophils: 58 %
Platelets: 238 10*3/uL (ref 150–450)
RBC: 4.29 x10E6/uL (ref 3.77–5.28)
RDW: 12.6 % (ref 11.7–15.4)
WBC: 6.1 10*3/uL (ref 3.4–10.8)

## 2020-12-15 LAB — CMP14+EGFR
ALT: 20 IU/L (ref 0–32)
AST: 18 IU/L (ref 0–40)
Albumin/Globulin Ratio: 1.7 (ref 1.2–2.2)
Albumin: 4.8 g/dL (ref 3.8–4.8)
Alkaline Phosphatase: 114 IU/L (ref 44–121)
BUN/Creatinine Ratio: 20 (ref 12–28)
BUN: 14 mg/dL (ref 8–27)
Bilirubin Total: 0.3 mg/dL (ref 0.0–1.2)
CO2: 25 mmol/L (ref 20–29)
Calcium: 9.7 mg/dL (ref 8.7–10.3)
Chloride: 100 mmol/L (ref 96–106)
Creatinine, Ser: 0.71 mg/dL (ref 0.57–1.00)
Globulin, Total: 2.9 g/dL (ref 1.5–4.5)
Glucose: 104 mg/dL — ABNORMAL HIGH (ref 70–99)
Potassium: 4.2 mmol/L (ref 3.5–5.2)
Sodium: 141 mmol/L (ref 134–144)
Total Protein: 7.7 g/dL (ref 6.0–8.5)
eGFR: 95 mL/min/{1.73_m2} (ref >59)

## 2020-12-15 NOTE — Telephone Encounter (Signed)
Lmtcb.

## 2020-12-15 NOTE — Telephone Encounter (Signed)
Repatha is not a statin, which is why it was suggested. We use it after Zetia in people who are intolerant to statins.

## 2020-12-15 NOTE — Telephone Encounter (Signed)
Pt aware of your recommendation for Repatha and she is good with the change since statins make her SOB & want to rip her skin off she said. Please send Rx to [facility]

## 2020-12-16 ENCOUNTER — Telehealth: Payer: Self-pay | Admitting: Family Medicine

## 2020-12-16 MED ORDER — REPATHA 140 MG/ML ~~LOC~~ SOSY: 140.0000 mg | PREFILLED_SYRINGE | SUBCUTANEOUS | 2 refills | Status: DC

## 2020-12-16 NOTE — Telephone Encounter (Signed)
Patient aware and verbalizes understanding. 

## 2020-12-16 NOTE — Telephone Encounter (Signed)
Pt called wanting to know if pt should stop taking her Ezetimibe while taking Repatha and also wants to know what are the chances of her getting diabetes while taking Repatha because she read that Repatha can cause diabetes.  Please advise and call patient.

## 2020-12-16 NOTE — Telephone Encounter (Signed)
I have sent Repatha to use every 2 weeks. She may need to bring it in for [place] to show her how to use it; she can do this as a nurse visit. It is an injection every 2 weeks.

## 2020-12-19 ENCOUNTER — Encounter: Payer: Self-pay | Admitting: Family Medicine

## 2020-12-19 NOTE — Telephone Encounter (Signed)
Yes I want her to take it. Her risk of having a heart attack, stroke, or other cardiovascular event it too high. We need to improve these numbers. She is intolerant to statins and it already on Zetia, so we need something else to help.

## 2020-12-19 NOTE — Telephone Encounter (Signed)
Pt made aware to take Zetia and Repatha. She wants clarification from [facility] that she needs to take the Repatha. Informed that per note that there is a chance it could raise her blood sugars but that Britney will keep an eye on her labs. Pt still would like to ask Britney.

## 2020-12-19 NOTE — Telephone Encounter (Signed)
Addressed in other telephone encounter

## 2020-12-19 NOTE — Telephone Encounter (Signed)
For now I recommend continuing Zetia. Once we get the numbers down we can try to come off of it. There is a possibility that Repatha can elevated blood sugars levels, but we will definitely keep an eye on this in her lab work.

## 2020-12-19 NOTE — Telephone Encounter (Signed)
Pt has concerns about taking repatha. She read that it can cause her to have diabetes.   She wants to let Alona Bene know that  levothyroxine (SYNTHROID) 200 MCG tablet was prescribed Dr Katrinka Blazing on 11/24/2020 for 90 day supply.  Pt aware Alona Bene is off today and will address tomorrow.

## 2020-12-19 NOTE — Telephone Encounter (Signed)
Patient aware and verbalized understanding. °

## 2020-12-20 ENCOUNTER — Telehealth: Payer: Self-pay | Admitting: Family Medicine

## 2020-12-20 NOTE — Telephone Encounter (Signed)
Your prior authorization request has been denied. COMPLETE APPEAL Your request for prior authorization was denied, but an appeal is available for your patient. For assistance, contact our support team at [phone].  Message from plan: PA Case: [id number], Status: Denied. Notification: Completed.

## 2020-12-20 NOTE — Telephone Encounter (Signed)
(  Key: [id number]) Rx #: [id number] Repatha SureClick 140MG /ML auto-injectors  Sent to plan

## 2020-12-21 NOTE — Telephone Encounter (Signed)
I looked it up and no reasoning was given - just a blanket statement of denial

## 2020-12-21 NOTE — Telephone Encounter (Signed)
Fax received regarding denial and the reason for denial [id number] did not see what they needed to approve the drug. It may be approved if heterozygous familial hypercholesterolemia, homozygous familial hypercholesterolemia, clinical atherosclerotic cardiovascular disease (defined as acute coronary syndromes, or a history of MI, stable or unstable angina, coronary or other arterial revascularization, stroke, TIA, or peripheral arterial disease of atherosclerotic origin), or severe primary hyperlipidemia (defined as LDL-C greater than or equal to 190 mg per dL.  If one of these diseases are present you can request a peer to peer review at [phone].

## 2020-12-21 NOTE — Telephone Encounter (Signed)
Does it give a reason for the denial?

## 2020-12-22 ENCOUNTER — Encounter: Payer: Self-pay | Admitting: Podiatry

## 2020-12-22 ENCOUNTER — Ambulatory Visit: Payer: Medicaid Other | Admitting: Podiatry

## 2020-12-22 ENCOUNTER — Other Ambulatory Visit: Payer: Self-pay

## 2020-12-22 ENCOUNTER — Ambulatory Visit (INDEPENDENT_AMBULATORY_CARE_PROVIDER_SITE_OTHER): Payer: Medicaid Other

## 2020-12-22 DIAGNOSIS — S91301A Unspecified open wound, right foot, initial encounter: Secondary | ICD-10-CM

## 2020-12-22 DIAGNOSIS — E11621 Type 2 diabetes mellitus with foot ulcer: Secondary | ICD-10-CM

## 2020-12-22 DIAGNOSIS — L97411 Non-pressure chronic ulcer of right heel and midfoot limited to breakdown of skin: Secondary | ICD-10-CM | POA: Diagnosis not present

## 2020-12-22 NOTE — Progress Notes (Signed)
  Subjective:  Patient ID: Dana Koch, female    DOB: June 17, 1955,   MRN: [id number]  No chief complaint on file.   65 y.o. female presents for concern of right heel wound. Relates it has been present for one month. Relates some discharge. Has been following with her PCP for this and has been dressing it herself. Relates pain and has neuropathy. States it is painful with walking. Also hoping ot have nails trimmed today . She is pre-diabetic. Last A1c was 5.7.  Denies any other pedal complaints. Denies n/v/f/c.   PCP: Deliah Boston FNP  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 02/10/2018   [facility] +HPV, will schedule colpo with Dr Emelda Fear    Hyperlipidemia    Hypertension    Hypothyroidism    Lymphedema    Neuropathy    Prediabetes    Venous stasis     Objective:  Physical Exam: Vascular: DP/PT pulses 2/4 bilateral. CFT <3 seconds. Normal hair growth on digits. No edema.  Skin. No lacerations or abrasions bilateral feet. Right heel wound with hyperkeratotic tissue surrounding and granular base. No erythema edema or purulence noted. Measuring 0.5 cm x 1 cm x 0.2 cm  Musculoskeletal: MMT 5/5 bilateral lower extremities in DF, PF, Inversion and Eversion. Deceased ROM in DF of ankle joint.  Neurological: Sensation intact to light touch.   Assessment:   1. Open wound of right foot, initial encounter      Plan:  Patient was evaluated and treated and all questions answered. Ulcer right heel limited to breakdown of skin.  XR reviewed. No osseous erosions noted.  -Debridement as below. -Dressed with betadine, DSD. -Off-loading with  CAM boot  -No abx indicated.  -Discussed glucose control and proper protein-rich diet.  -Discussed if any worsening redness, pain, fever or chills to call or may need to report to the emergency room. Patient expressed understanding.   Procedure: Excisional Debridement  of Wound Rationale: Removal of non-viable soft tissue from the wound to promote healing.  Anesthesia: none Pre-Debridement Wound Measurements: 0.2 cm x 1 cm x 0.2 cm  Post-Debridement Wound Measurements: 0.5 cm x 1 cm x 0.2 cm  Type of Debridement: Sharp Excisional Tissue Removed: Non-viable soft tissue Depth of Debridement: subcutaneous tissue. Technique: Sharp excisional debridement to bleeding, viable wound base.  Dressing: Dry, sterile, compression dressing. Disposition: Patient tolerated procedure well. Patient to return in 2 week for follow-up.  No follow-ups on file.   Louann Sjogren, DPM

## 2020-12-22 NOTE — Telephone Encounter (Signed)
Patient aware and verbalized understanding. °

## 2020-12-22 NOTE — Telephone Encounter (Signed)
Thank you. Can we please make patient aware we are unable to get this covered for her. Continue previous regimen.

## 2020-12-29 ENCOUNTER — Ambulatory Visit (INDEPENDENT_AMBULATORY_CARE_PROVIDER_SITE_OTHER): Payer: Medicaid Other | Admitting: Nurse Practitioner

## 2021-01-05 ENCOUNTER — Ambulatory Visit: Payer: Medicaid Other | Admitting: Podiatry

## 2021-01-12 ENCOUNTER — Other Ambulatory Visit: Payer: Self-pay

## 2021-01-12 ENCOUNTER — Encounter: Payer: Self-pay | Admitting: Podiatry

## 2021-01-12 ENCOUNTER — Ambulatory Visit: Payer: Medicaid Other | Admitting: Podiatry

## 2021-01-12 DIAGNOSIS — L97411 Non-pressure chronic ulcer of right heel and midfoot limited to breakdown of skin: Secondary | ICD-10-CM

## 2021-01-12 DIAGNOSIS — E11621 Type 2 diabetes mellitus with foot ulcer: Secondary | ICD-10-CM

## 2021-01-12 NOTE — Progress Notes (Signed)
°  Subjective:  Patient ID: Dana Koch, female    DOB: Jul 25, 1955,   MRN: [id number]  Chief Complaint  Patient presents with   Wound Check    wound check.- right foot , patient states wound is getting better     65 y.o. female presents for follow-up of right heel wound. Has been dressing as instructed and feels it is getting better. Denies any drainage. Relates she only wore the CAM boot for a day as it made her unbalanced and felt dangerous. Has been in regular shoes.   She is pre-diabetic. Last A1c was 5.7.  Denies any other pedal complaints. Denies n/v/f/c.   PCP: Deliah Boston FNP  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 02/10/2018   [facility] +HPV, will schedule colpo with Dr Emelda Fear    Hyperlipidemia    Hypertension    Hypothyroidism    Lymphedema    Neuropathy    Prediabetes    Venous stasis     Objective:  Physical Exam: Vascular: DP/PT pulses 2/4 bilateral. CFT <3 seconds. Normal hair growth on digits. No edema.  Skin. No lacerations or abrasions bilateral feet. Right heel wound with hyperkeratotic tissue surrounding and granular base. No erythema edema or purulence noted. Measuring 0.5 cm x 1 cm x 0.2 cm  Musculoskeletal: MMT 5/5 bilateral lower extremities in DF, PF, Inversion and Eversion. Deceased ROM in DF of ankle joint.  Neurological: Sensation intact to light touch.   Assessment:   1. Diabetic ulcer of right heel associated with type 2 diabetes mellitus, limited to breakdown of skin (HCC)       Plan:  Patient was evaluated and treated and all questions answered. Ulcer right heel limited to breakdown of skin.  XR reviewed. No osseous erosions noted.  -Debridement as below. -Dressed with betadine, DSD. -Off-loading with  CAM boot, patient relates she is unablanced in CAM boot and has been wearing regular shoes.  Extra padding and horshoe pads dispensed to offload heel.  -No  abx indicated.  -Discussed glucose control and proper protein-rich diet.  -Discussed if any worsening redness, pain, fever or chills to call or may need to report to the emergency room. Patient expressed understanding.   Procedure: Excisional Debridement of Wound Rationale: Removal of non-viable soft tissue from the wound to promote healing.  Anesthesia: none Pre-Debridement Wound Measurements: Overylying hyperkeratosis.  Post-Debridement Wound Measurements: 0.5 cm x 1 cm x 0.2 cm  Type of Debridement: Sharp Excisional Tissue Removed: Non-viable soft tissue Depth of Debridement: subcutaneous tissue. Technique: Sharp excisional debridement to bleeding, viable wound base.  Dressing: Dry, sterile, compression dressing. Disposition: Patient tolerated procedure well. Patient to return in 2 week for follow-up.  Return in about 2 weeks (around 01/26/2021) for wound check.   Louann Sjogren, DPM

## 2021-01-26 ENCOUNTER — Other Ambulatory Visit: Payer: Self-pay

## 2021-01-26 ENCOUNTER — Encounter: Payer: Self-pay | Admitting: Podiatry

## 2021-01-26 ENCOUNTER — Ambulatory Visit: Payer: Medicaid Other | Admitting: Podiatry

## 2021-01-26 DIAGNOSIS — L97411 Non-pressure chronic ulcer of right heel and midfoot limited to breakdown of skin: Secondary | ICD-10-CM | POA: Diagnosis not present

## 2021-01-26 DIAGNOSIS — E11621 Type 2 diabetes mellitus with foot ulcer: Secondary | ICD-10-CM | POA: Diagnosis not present

## 2021-01-26 NOTE — Progress Notes (Signed)
°  Subjective:  Patient ID: Dana Koch, female    DOB: 1955-12-01,   MRN: [id number]  Chief Complaint  Patient presents with   Wound Check    Right foot wound. Pt states its getting better but painful sometimes    66 y.o. female presents for follow-up of right heel wound. Has been dressing as instructed and feels it is getting better. Denies any drainage. Has been in regular shoes cannot tolerate CAM boot.   She is pre-diabetic. Last A1c was 5.7.  Denies any other pedal complaints. Denies n/v/f/c.   PCP: Hendricks Limes FNP  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 02/10/2018   [facility] +HPV, will schedule colpo with Dr Glo Herring    Hyperlipidemia    Hypertension    Hypothyroidism    Lymphedema    Neuropathy    Prediabetes    Venous stasis     Objective:  Physical Exam: Vascular: DP/PT pulses 2/4 bilateral. CFT <3 seconds. Normal hair growth on digits. No edema.  Skin. No lacerations or abrasions bilateral feet. Right heel wound with hyperkeratotic tissue surrounding and granular base. No erythema edema or purulence noted. Measuring 0.5 cm x 1 cm x 0.2 cm  Musculoskeletal: MMT 5/5 bilateral lower extremities in DF, PF, Inversion and Eversion. Deceased ROM in DF of ankle joint.  Neurological: Sensation intact to light touch.   Assessment:   1. Diabetic ulcer of right heel associated with type 2 diabetes mellitus, limited to breakdown of skin ([place])        Plan:  Patient was evaluated and treated and all questions answered. Ulcer right heel limited to breakdown of skin.  XR reviewed. No osseous erosions noted.  -Debridement as below. -Dressed with betadine, DSD. -Off-loading with  CAM boot, discussed trying CAM boot again to accelerate healing.  Extra padding and horshoe pads dispensed to offload heel.  -No abx indicated.  -Discussed glucose control and proper protein-rich diet.  -Discussed  if any worsening redness, pain, fever or chills to call or may need to report to the emergency room. Patient expressed understanding.   Procedure: Excisional Debridement of Wound Rationale: Removal of non-viable soft tissue from the wound to promote healing.  Anesthesia: none Pre-Debridement Wound Measurements: Overylying hyperkeratosis.  Post-Debridement Wound Measurements: 0.5 cm x 1 cm x 0.2 cm  Type of Debridement: Sharp Excisional Tissue Removed: Non-viable soft tissue Depth of Debridement: subcutaneous tissue. Technique: Sharp excisional debridement to bleeding, viable wound base.  Dressing: Dry, sterile, compression dressing. Disposition: Patient tolerated procedure well. Patient to return in 2 week for follow-up.  Return in about 2 weeks (around 02/09/2021) for wound check.   Lorenda Peck, DPM

## 2021-01-30 ENCOUNTER — Telehealth: Payer: Self-pay | Admitting: *Deleted

## 2021-01-30 NOTE — Telephone Encounter (Addendum)
Patient is calling to request a darco shoe for her foot ulcer instead of using the boot ?Please advise.

## 2021-01-31 NOTE — Telephone Encounter (Signed)
Returned the call to patient giving physician's recommendations thru vmessage, no answer.

## 2021-02-16 ENCOUNTER — Encounter: Payer: Self-pay | Admitting: Podiatry

## 2021-02-16 ENCOUNTER — Ambulatory Visit: Payer: Medicaid Other | Admitting: Podiatry

## 2021-02-16 ENCOUNTER — Ambulatory Visit (INDEPENDENT_AMBULATORY_CARE_PROVIDER_SITE_OTHER): Payer: Medicaid Other

## 2021-02-16 ENCOUNTER — Other Ambulatory Visit: Payer: Self-pay

## 2021-02-16 DIAGNOSIS — E11621 Type 2 diabetes mellitus with foot ulcer: Secondary | ICD-10-CM

## 2021-02-16 DIAGNOSIS — L97411 Non-pressure chronic ulcer of right heel and midfoot limited to breakdown of skin: Secondary | ICD-10-CM

## 2021-02-16 NOTE — Progress Notes (Signed)
°  Subjective:  Patient ID: Dana Koch, female    DOB: 07-13-1955,   MRN: [id number]  Chief Complaint  Patient presents with   Wound Check    Right foot wound check    66 y.o. female presents for follow-up of right heel wound. Has been dressing as instructed and feels it is staying about the same and is frustrated. Denies any drainage. Has been in regular shoes cannot tolerate CAM boot.   She is pre-diabetic but does have neruopathy. Last A1c was 5.7.  Denies any other pedal complaints. Denies n/v/f/c.   PCP: Deliah Boston FNP  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 02/10/2018   [facility] +HPV, will schedule colpo with Dr Emelda Fear    Hyperlipidemia    Hypertension    Hypothyroidism    Lymphedema    Neuropathy    Prediabetes    Venous stasis     Objective:  Physical Exam: Vascular: DP/PT pulses 2/4 bilateral. CFT <3 seconds. Normal hair growth on digits. No edema.  Skin. No lacerations or abrasions bilateral feet. Right heel wound with hyperkeratotic tissue surrounding and granular base. No erythema edema or purulence noted. Measuring 0.5 cm x 1 cm x 0.2 cm  Musculoskeletal: MMT 5/5 bilateral lower extremities in DF, PF, Inversion and Eversion. Deceased ROM in DF of ankle joint.  Neurological: Sensation intact to light touch.   Assessment:   1. Diabetic ulcer of right heel associated with type 2 diabetes mellitus, limited to breakdown of skin (HCC)         Plan:  Patient was evaluated and treated and all questions answered. Ulcer right heel limited to breakdown of skin.  XR reviewed. No osseous erosions noted.  -Debridement as below. -Dressed with betadine, DSD. -Off-loading with heel offloading shoe. Discussed keeping weight off this foot as much as she can. Patient has been in regular shoes and walking on the foot. Discussed with her the best way to get this healed is to keep it  offloaded. Will try the shoe but discussed if no  improvement may need to try casting.  Did not tolerated the offloading pads.  -No abx indicated.  -Discussed glucose control and proper protein-rich diet.  -Discussed if any worsening redness, pain, fever or chills to call or may need to report to the emergency room. Patient expressed understanding.   Procedure: Excisional Debridement of Wound Rationale: Removal of non-viable soft tissue from the wound to promote healing.  Anesthesia: none Pre-Debridement Wound Measurements: Overylying hyperkeratosis.  Post-Debridement Wound Measurements: 0.5 cm x 1 cm x 0.2 cm  Type of Debridement: Sharp Excisional Tissue Removed: Non-viable soft tissue Depth of Debridement: subcutaneous tissue. Technique: Sharp excisional debridement to bleeding, viable wound base.  Dressing: Dry, sterile, compression dressing. Disposition: Patient tolerated procedure well. Patient to return in 2 week for follow-up.  No follow-ups on file.   Louann Sjogren, DPM

## 2021-02-20 ENCOUNTER — Telehealth: Payer: Self-pay | Admitting: Podiatry

## 2021-02-20 ENCOUNTER — Other Ambulatory Visit: Payer: Self-pay | Admitting: Podiatry

## 2021-02-20 DIAGNOSIS — L97411 Non-pressure chronic ulcer of right heel and midfoot limited to breakdown of skin: Secondary | ICD-10-CM

## 2021-02-20 DIAGNOSIS — E11621 Type 2 diabetes mellitus with foot ulcer: Secondary | ICD-10-CM

## 2021-02-20 NOTE — Telephone Encounter (Signed)
Patient would like to be referred to wound care center @ [facility] in [place]  Please Advise

## 2021-02-23 ENCOUNTER — Other Ambulatory Visit: Payer: Self-pay | Admitting: Family Medicine

## 2021-02-23 DIAGNOSIS — E785 Hyperlipidemia, unspecified: Secondary | ICD-10-CM

## 2021-03-02 ENCOUNTER — Ambulatory Visit: Payer: Medicaid Other | Admitting: Podiatry

## 2021-03-10 DIAGNOSIS — L97412 Non-pressure chronic ulcer of right heel and midfoot with fat layer exposed: Secondary | ICD-10-CM | POA: Diagnosis not present

## 2021-03-10 DIAGNOSIS — L89613 Pressure ulcer of right heel, stage 3: Secondary | ICD-10-CM | POA: Diagnosis not present

## 2021-03-10 DIAGNOSIS — G629 Polyneuropathy, unspecified: Secondary | ICD-10-CM | POA: Diagnosis not present

## 2021-03-17 DIAGNOSIS — G629 Polyneuropathy, unspecified: Secondary | ICD-10-CM | POA: Diagnosis not present

## 2021-03-17 DIAGNOSIS — L97412 Non-pressure chronic ulcer of right heel and midfoot with fat layer exposed: Secondary | ICD-10-CM | POA: Diagnosis not present

## 2021-03-22 ENCOUNTER — Encounter: Payer: Self-pay | Admitting: Family Medicine

## 2021-03-22 ENCOUNTER — Ambulatory Visit: Payer: Medicaid Other | Admitting: Family Medicine

## 2021-03-22 VITALS — BP 139/84 | HR 88 | Temp 97.5°F | Ht 75.0 in | Wt 354.0 lb

## 2021-03-22 DIAGNOSIS — E876 Hypokalemia: Secondary | ICD-10-CM | POA: Diagnosis not present

## 2021-03-22 DIAGNOSIS — E785 Hyperlipidemia, unspecified: Secondary | ICD-10-CM

## 2021-03-22 LAB — BMP8+EGFR
BUN/Creatinine Ratio: 18 (ref 12–28)
BUN: 15 mg/dL (ref 8–27)
CO2: 29 mmol/L (ref 20–29)
Calcium: 9.9 mg/dL (ref 8.7–10.3)
Chloride: 95 mmol/L — ABNORMAL LOW (ref 96–106)
Creatinine, Ser: 0.82 mg/dL (ref 0.57–1.00)
Glucose: 101 mg/dL — ABNORMAL HIGH (ref 70–99)
Potassium: 3.6 mmol/L (ref 3.5–5.2)
Sodium: 140 mmol/L (ref 134–144)
eGFR: 80 mL/min/{1.73_m2} (ref >59)

## 2021-03-22 MED ORDER — REPATHA 140 MG/ML ~~LOC~~ SOSY: 140.0000 mg | PREFILLED_SYRINGE | SUBCUTANEOUS | 2 refills | Status: DC

## 2021-03-22 NOTE — Progress Notes (Signed)
? ?Assessment & Plan:  ?1. Hypokalemia ?Labs to reassess. Continue potassium supplement. We will fax results to Dr. Zigmund Daniel.  ?- BMP8+EGFR ? ?2. Dyslipidemia, goal LDL below 100 ?Patient agreeable to starting Repatha after discussing and having questions answered.  ?- Evolocumab (REPATHA) 140 MG/ML SOSY; Inject 140 mg into the skin every 14 (fourteen) days.  Dispense: 2 mL; Refill: 2 ? ? ?Follow up plan: Return as scheduled. ? ?Hendricks Limes, MSN, APRN, FNP-C ?[place] ? ?Subjective:  ? ?Patient ID: Dana Koch, female    DOB: 09-04-1955, 66 y.o.   MRN: [id number] ? ?HPI: ?Dana Koch is a 66 y.o. female presenting on 03/22/2021 for Labs Only (Potassium re check per wound care) ? ?Patient had lab work completed with Dr. Zigmund Daniel, her wound care provider on 03/17/2021 which showed a potassium level of 2.8. He recommended repeating, but since he is an hour away she came here instead. Patient denies palpitations, extreme fatigue, muscle weakness/spasms, and tingling/numbness. She is having constipation. She is taking an OTC potassium supplement and eating foods rich in potassium since being notified. She admits she has not been eating like she is suppose to. She does take both furosemide and hydrochlorothiazide, but states she has been on both of these for many years. She tried stopping the furosemide as directed by Dr. Zigmund Daniel, but had such an increase in swelling she had to resume it.  ? ?Patient has not been taking Repatha as previously prescribed.  ? ? ?ROS: Negative unless specifically indicated above in HPI.  ? ?Relevant past medical history reviewed and updated as indicated.  ? ?Allergies and medications reviewed and updated. ? ? ?Current Outpatient Medications:  ?  Cyanocobalamin (B-12) 1000 MCG CAPS, Take by mouth., Disp: , Rfl:  ?  ezetimibe (ZETIA) 10 MG tablet, TAKE ONE TABLET ONCE DAILY, Disp: 90 tablet, Rfl: 1 ?  furosemide (LASIX) 20 MG tablet, Take 1 tablet (20 mg total) by mouth daily.,  Disp: 90 tablet, Rfl: 1 ?  hydrochlorothiazide (HYDRODIURIL) 25 MG tablet, Take 1 tablet (25 mg total) by mouth daily., Disp: 90 tablet, Rfl: 1 ?  levothyroxine (SYNTHROID) 200 MCG tablet, TAKE 1 TABLET DAILY, Disp: 90 tablet, Rfl: 3 ?  liothyronine (CYTOMEL) 5 MCG tablet, Take 5 mcg by mouth daily., Disp: , Rfl:  ?  Omega-3 Fatty Acids (FISH OIL) 1000 MG CAPS, , Disp: , Rfl:  ?  Semaglutide (OZEMPIC, 0.25 OR 0.5 MG/DOSE, [place]), Inject into the skin., Disp: , Rfl:  ?  Evolocumab (REPATHA) 140 MG/ML SOSY, Inject 140 mg into the skin every 14 (fourteen) days. (Patient not taking: Reported on 03/22/2021), Disp: 2 mL, Rfl: 2 ? ?Allergies  ?Allergen Reactions  ? Demerol [Meperidine] Other (See Comments)  ?  Stops my heart  ? Statins Other (See Comments) and Shortness Of Breath  ?  sob  ? Amoxicillin Hives  ? Red Yeast Rice [Cholestin] Itching  ? ? ?Objective:  ? ?BP 139/84   Pulse 88   Temp (!) 97.5 ?F (36.4 ?C) (Temporal)   Ht 6' 3"  (1.905 m)   Wt (!) 354 lb (160.6 kg)   SpO2 97%   BMI 44.25 kg/m?   ? ?Physical Exam ?Vitals reviewed.  ?Constitutional:   ?   General: She is not in acute distress. ?   Appearance: Normal appearance. She is not ill-appearing, toxic-appearing or diaphoretic.  ?HENT:  ?   Head: Normocephalic and atraumatic.  ?Eyes:  ?   General: No scleral icterus.    ?  Right eye: No discharge.     ?   Left eye: No discharge.  ?   Conjunctiva/sclera: Conjunctivae normal.  ?Cardiovascular:  ?   Rate and Rhythm: Normal rate and regular rhythm.  ?   Heart sounds: Normal heart sounds. No murmur heard. ?  No friction rub. No gallop.  ?Pulmonary:  ?   Effort: Pulmonary effort is normal. No respiratory distress.  ?   Breath sounds: Normal breath sounds. No stridor. No wheezing, rhonchi or rales.  ?Musculoskeletal:     ?   General: Normal range of motion.  ?   Cervical back: Normal range of motion.  ?Feet:  ?   Comments: Right foot casted in a boot. ?Skin: ?   General: Skin is warm and dry.  ?   Capillary Refill:  Capillary refill takes less than 2 seconds.  ?Neurological:  ?   General: No focal deficit present.  ?   Mental Status: She is alert and oriented to person, place, and time. Mental status is at baseline.  ?Psychiatric:     ?   Mood and Affect: Mood normal.     ?   Behavior: Behavior normal.     ?   Thought Content: Thought content normal.     ?   Judgment: Judgment normal.  ? ? ? ? ? ? ?

## 2021-03-23 ENCOUNTER — Telehealth: Payer: Self-pay | Admitting: Family Medicine

## 2021-03-23 ENCOUNTER — Encounter: Payer: Self-pay | Admitting: Family Medicine

## 2021-03-23 DIAGNOSIS — Z789 Other specified health status: Secondary | ICD-10-CM

## 2021-03-23 DIAGNOSIS — E785 Hyperlipidemia, unspecified: Secondary | ICD-10-CM

## 2021-03-24 DIAGNOSIS — L97412 Non-pressure chronic ulcer of right heel and midfoot with fat layer exposed: Secondary | ICD-10-CM | POA: Diagnosis not present

## 2021-03-24 MED ORDER — NEXLIZET 180-10 MG PO TABS: 1.0000 | ORAL_TABLET | Freq: Every day | ORAL | 2 refills | Status: DC

## 2021-03-27 ENCOUNTER — Telehealth: Payer: Self-pay

## 2021-03-27 DIAGNOSIS — Z789 Other specified health status: Secondary | ICD-10-CM

## 2021-03-27 DIAGNOSIS — E785 Hyperlipidemia, unspecified: Secondary | ICD-10-CM

## 2021-03-27 NOTE — Telephone Encounter (Signed)
Nexlizet 180-10MG  tablets ? ?[facility] - PA Case ID: [id number] - Rx #: [id number] ? ?Outcome ?Approvedtoday ?PA Case: [id number], Status: Approved, Coverage Starts on: 03/27/2021 12:00:00 AM, Coverage Ends on: 03/27/2022 12:00:00 AM. ? ?Pharmacy aware  ?

## 2021-03-27 NOTE — Telephone Encounter (Signed)
Repatha SureClick 140MG /ML auto-injectors ? (Key: [id number]) ?Rx #: ? ?SENT TO PLAN ?

## 2021-03-29 NOTE — Telephone Encounter (Signed)
Repatha taken off of medication list. It looks like Nexlizet was approved. She will start this. ?

## 2021-03-29 NOTE — Telephone Encounter (Signed)
Attempted to contact patient - NA °

## 2021-03-29 NOTE — Addendum Note (Signed)
Addended by: Deliah Boston F on: 03/29/2021 01:59 PM ? ? Modules accepted: Orders ? ?

## 2021-03-29 NOTE — Telephone Encounter (Signed)
Your prior authorization request has been denied. ?COMPLETE APPEAL ?Your request for prior authorization was denied, but an appeal is available for your patient. For assistance, contact our support team at [phone]. ? ?Message from plan: PA Case: [id number], Status: Denied. Notification: Completed. ? ? ? ? ?If you can give me more specific DX - I can try to appeal. ? ?

## 2021-03-31 DIAGNOSIS — L97412 Non-pressure chronic ulcer of right heel and midfoot with fat layer exposed: Secondary | ICD-10-CM | POA: Diagnosis not present

## 2021-04-03 ENCOUNTER — Encounter: Payer: Self-pay | Admitting: Family Medicine

## 2021-04-03 NOTE — Telephone Encounter (Signed)
Lmtcb No call back  This encounter will be closed  

## 2021-04-03 NOTE — Telephone Encounter (Signed)
Attempted to contact - NA 

## 2021-04-07 DIAGNOSIS — L97412 Non-pressure chronic ulcer of right heel and midfoot with fat layer exposed: Secondary | ICD-10-CM | POA: Diagnosis not present

## 2021-04-10 NOTE — Telephone Encounter (Signed)
Patient aware.

## 2021-04-14 DIAGNOSIS — L97412 Non-pressure chronic ulcer of right heel and midfoot with fat layer exposed: Secondary | ICD-10-CM | POA: Diagnosis not present

## 2021-04-20 ENCOUNTER — Telehealth: Payer: Self-pay | Admitting: Family Medicine

## 2021-04-20 DIAGNOSIS — L97511 Non-pressure chronic ulcer of other part of right foot limited to breakdown of skin: Secondary | ICD-10-CM | POA: Diagnosis not present

## 2021-04-20 DIAGNOSIS — L03031 Cellulitis of right toe: Secondary | ICD-10-CM | POA: Diagnosis not present

## 2021-04-20 DIAGNOSIS — G629 Polyneuropathy, unspecified: Secondary | ICD-10-CM | POA: Diagnosis not present

## 2021-04-20 DIAGNOSIS — L97512 Non-pressure chronic ulcer of other part of right foot with fat layer exposed: Secondary | ICD-10-CM | POA: Diagnosis not present

## 2021-04-20 DIAGNOSIS — B9561 Methicillin susceptible Staphylococcus aureus infection as the cause of diseases classified elsewhere: Secondary | ICD-10-CM | POA: Diagnosis not present

## 2021-04-20 DIAGNOSIS — L97412 Non-pressure chronic ulcer of right heel and midfoot with fat layer exposed: Secondary | ICD-10-CM | POA: Diagnosis not present

## 2021-04-20 DIAGNOSIS — L97411 Non-pressure chronic ulcer of right heel and midfoot limited to breakdown of skin: Secondary | ICD-10-CM | POA: Diagnosis not present

## 2021-04-20 NOTE — Telephone Encounter (Signed)
Reviewed Allergies. Patient is not allergic to Doxycycline. Only Allergic to Amoxicillin. Advised Patient. ?

## 2021-04-20 NOTE — Telephone Encounter (Signed)
Patient calling to check on her allergies, she was prescribed doxycycline by a doctor [facility] and she thinks that she may be allergic to this medication. Please call back and advise.  ?

## 2021-04-28 DIAGNOSIS — L0390 Cellulitis, unspecified: Secondary | ICD-10-CM | POA: Diagnosis not present

## 2021-04-28 DIAGNOSIS — L97512 Non-pressure chronic ulcer of other part of right foot with fat layer exposed: Secondary | ICD-10-CM | POA: Diagnosis not present

## 2021-04-28 DIAGNOSIS — L97412 Non-pressure chronic ulcer of right heel and midfoot with fat layer exposed: Secondary | ICD-10-CM | POA: Diagnosis not present

## 2021-05-05 DIAGNOSIS — L905 Scar conditions and fibrosis of skin: Secondary | ICD-10-CM | POA: Diagnosis not present

## 2021-05-05 DIAGNOSIS — L97412 Non-pressure chronic ulcer of right heel and midfoot with fat layer exposed: Secondary | ICD-10-CM | POA: Diagnosis not present

## 2021-05-23 DIAGNOSIS — E039 Hypothyroidism, unspecified: Secondary | ICD-10-CM | POA: Diagnosis not present

## 2021-05-25 ENCOUNTER — Encounter: Payer: Self-pay | Admitting: Family Medicine

## 2021-05-25 DIAGNOSIS — G629 Polyneuropathy, unspecified: Secondary | ICD-10-CM

## 2021-06-01 DIAGNOSIS — L97412 Non-pressure chronic ulcer of right heel and midfoot with fat layer exposed: Secondary | ICD-10-CM | POA: Diagnosis not present

## 2021-06-13 ENCOUNTER — Ambulatory Visit: Payer: Medicaid Other | Admitting: Family Medicine

## 2021-06-13 ENCOUNTER — Encounter: Payer: Self-pay | Admitting: Family Medicine

## 2021-06-13 VITALS — BP 116/72 | HR 84 | Temp 98.0°F | Ht 75.0 in | Wt 333.0 lb

## 2021-06-13 DIAGNOSIS — E785 Hyperlipidemia, unspecified: Secondary | ICD-10-CM | POA: Diagnosis not present

## 2021-06-13 DIAGNOSIS — R7303 Prediabetes: Secondary | ICD-10-CM

## 2021-06-13 DIAGNOSIS — Z0001 Encounter for general adult medical examination with abnormal findings: Secondary | ICD-10-CM

## 2021-06-13 DIAGNOSIS — I10 Essential (primary) hypertension: Secondary | ICD-10-CM

## 2021-06-13 DIAGNOSIS — Z0000 Encounter for general adult medical examination without abnormal findings: Secondary | ICD-10-CM

## 2021-06-13 LAB — BAYER DCA HB A1C WAIVED: HB A1C (BAYER DCA - WAIVED): 5.5 % (ref 4.8–5.6)

## 2021-06-13 MED ORDER — HYDROCHLOROTHIAZIDE 25 MG PO TABS: 25.0000 mg | ORAL_TABLET | Freq: Every day | ORAL | 1 refills | Status: DC

## 2021-06-13 MED ORDER — FUROSEMIDE 20 MG PO TABS: 20.0000 mg | ORAL_TABLET | Freq: Every day | ORAL | 1 refills | Status: DC

## 2021-06-13 NOTE — Progress Notes (Signed)
Complete physical exam  Patient: Dana Koch   DOB: 09-10-1955   66 y.o. Female  MRN: [id number]  Subjective:    Chief Complaint  Patient presents with   Medical Management of Chronic Issues    CPE no pap- sees [place]. Last pap abnormal/ +HPV.    Hypertension    Dana Koch is a 66 y.o. female who presents today for a complete physical exam. She reports consuming a general diet. Exercise is limited by orthopedic condition(s): neuropathic ulcer of foot. She generally feels well. She reports sleeping well. She does not have additional problems to discuss today.   Most recent fall risk assessment:    06/13/2021    9:42 AM  Fall Risk   Falls in the past year? 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[place]) 63/84/6659   Metabolic syndrome 93/57/0177   Arthritis 05/03/2020   Prediabetes 05/03/2020   Dyslipidemia, goal LDL below 100 03/20/2018   Morbid obesity ([place]) 06/26/2017   Lymph edema 02/20/2016   Essential hypertension 02/20/2016   Hypothyroidism 02/20/2016   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 02/10/2018   [facility] +HPV, will schedule colpo with Dr Glo Herring    Hyperlipidemia    Hypertension    Hypothyroidism    Lymphedema    Neuropathy    Prediabetes    Venous stasis    Past Surgical History:  Procedure Laterality Date   CHOLECYSTECTOMY  01/16/1987   LEG SURGERY Left    broken left leg   TOTAL ABDOMINAL HYSTERECTOMY  2020   Family History  Problem Relation Age of Onset   Thyroid disease Mother    Bone cancer Mother    Thyroid disease Father    Emphysema Father    Diabetes Father    Neuropathy Father    Diabetes  Brother    Thyroid disease Brother    Thyroid disease Sister    Allergies  Allergen Reactions   Demerol [Meperidine] Other (See Comments)    Stops my heart   Statins Other (See Comments) and Shortness Of Breath    sob   Amoxicillin Hives   Red Yeast Rice [Cholestin] Itching   Patient Care Team: Loman Brooklyn, FNP as PCP - General (Family Medicine)   Outpatient Medications Prior to Visit  Medication Sig   Cyanocobalamin (B-12) 1000 MCG CAPS Take by mouth.   ezetimibe (ZETIA) 10 MG tablet TAKE ONE TABLET ONCE DAILY   levothyroxine (SYNTHROID) 200 MCG tablet TAKE 1 TABLET DAILY   liothyronine (CYTOMEL) 5 MCG tablet Take 5 mcg by mouth daily.   Omega-3 Fatty Acids (FISH OIL) 1000 MG CAPS    Semaglutide (OZEMPIC, 0.25 OR 0.5 MG/DOSE, [place]) Inject into the skin.   [DISCONTINUED] furosemide (LASIX) 20 MG tablet Take 1 tablet (20 mg total) by mouth daily.   [DISCONTINUED] hydrochlorothiazide (HYDRODIURIL) 25 MG tablet Take 1 tablet (25 mg total) by mouth daily.   [DISCONTINUED] Bempedoic Acid-Ezetimibe (NEXLIZET) 180-10 MG TABS Take 1 tablet by mouth daily.   No facility-administered medications prior to visit.   Review of Systems  Constitutional:  Negative for chills, fever, malaise/fatigue and weight loss.  HENT:  Negative for congestion, ear discharge, ear pain, nosebleeds, sinus pain, sore throat and tinnitus.   Eyes:  Negative for blurred vision, double vision, pain, discharge and redness.  Respiratory:  Negative for cough, shortness of breath and wheezing.   Cardiovascular:  Negative for chest pain, palpitations and leg swelling.  Gastrointestinal:  Negative for abdominal pain, constipation, diarrhea, heartburn, nausea and vomiting.  Genitourinary:  Negative for dysuria, frequency and urgency.  Musculoskeletal:  Negative for myalgias.  Skin:  Negative for rash.  Neurological:  Negative for dizziness, seizures, weakness and headaches.  Psychiatric/Behavioral:  Negative for  depression, substance abuse and suicidal ideas. The patient is not nervous/anxious.       Objective:     BP 116/72   Pulse 84   Temp 98 F (36.7 C)   Ht _0  (1.905 m)   Wt (!) 333 lb (151 kg)   SpO2 94%   BMI 41.62 kg/m   Wt Readings from Last 3 Encounters:  06/13/21 (!) 333 lb (151 kg)  03/22/21 (!) 354 lb (160.6 kg)  12/14/20 (!) 354 lb (160.6 kg)   Physical Exam Vitals reviewed.  Constitutional:      General: She is not in acute distress.    Appearance: Normal appearance. She is morbidly obese. She is not ill-appearing, toxic-appearing or diaphoretic.  HENT:     Head: Normocephalic and atraumatic.     Right Ear: Tympanic membrane, ear canal and external ear normal. There is no impacted cerumen.     Left Ear: Tympanic membrane, ear canal and external ear normal. There is no impacted cerumen.     Nose: Nose normal. No congestion or rhinorrhea.     Mouth/Throat:     Mouth: Mucous membranes are moist.     Pharynx: Oropharynx is clear. No oropharyngeal exudate or posterior oropharyngeal erythema.  Eyes:     General: No scleral icterus.       Right eye: No discharge.        Left eye: No discharge.     Conjunctiva/sclera: Conjunctivae normal.     Pupils: Pupils are equal, round, and reactive to light.  Cardiovascular:     Rate and Rhythm: Normal rate and regular rhythm.     Heart sounds: Normal heart sounds. No murmur heard.   No friction rub. No gallop.  Pulmonary:     Effort: Pulmonary effort is normal. No respiratory distress.     Breath sounds: Normal breath sounds. No stridor. No wheezing, rhonchi or rales.  Abdominal:     General: Abdomen is flat. Bowel sounds are normal. There is no distension.     Palpations: Abdomen is soft. There is no hepatomegaly, splenomegaly or mass.     Tenderness: There is no abdominal tenderness. There is no guarding or rebound.     Hernia: No hernia is present.  Musculoskeletal:        General: Normal range of motion.     Cervical  back: Normal range of motion and neck supple. No rigidity. No muscular tenderness.  Lymphadenopathy:     Cervical: No cervical adenopathy.  Skin:    General: Skin is warm and dry.     Capillary Refill: Capillary refill takes less than 2 seconds.  Neurological:     General: No focal deficit present.     Mental Status: She is alert and oriented to person, place, and time. Mental status is at baseline.  Psychiatric:  Mood and Affect: Mood normal.        Behavior: Behavior normal.        Thought Content: Thought content normal.        Judgment: Judgment normal.     No results found for any visits on 06/13/21.     Assessment & Plan:    Routine Health Maintenance and Physical Exam  Immunization History  Administered Date(s) Administered   Influenza, Quadrivalent, Recombinant, Inj, Pf 10/28/2020   Influenza,inj,Quad PF,6+ Mos 11/11/2017, 10/28/2018, 11/20/2019   Influenza-Unspecified 10/11/2015, 11/11/2017   Moderna Covid-19 Vaccine Bivalent Booster 59yr & up 10/28/2020   Moderna Sars-Covid-2 Vaccination 05/11/2019, 06/08/2019, 12/22/2019   Tdap 10/11/2015   Zoster Recombinat (Shingrix) 03/20/2018, 12/18/2018   Zoster, Live 03/20/2018   Zoster, Unspecified 03/20/2018   Health Maintenance  Topic Date Due   INFLUENZA VACCINE  08/15/2021   TETANUS/TDAP  10/10/2025   COVID-19 Vaccine  Completed   Hepatitis C Screening  Completed   HIV Screening  Completed   Zoster Vaccines- Shingrix  Completed   HPV VACCINES  Aged Out   MAMMOGRAM  Discontinued   COLONOSCOPY (Pts [id number] coverage will need to be confirmed)  Discontinued    Discussed health benefits of physical activity, and encouraged her to engage in regular exercise appropriate for her age and condition.    1. Well adult exam Preventive health education provided.  Patient declines mammogram and colonoscopy. - CBC with Differential/Platelet - CMP14+EGFR - Lipid panel - Bayer DCA Hb A1c Waived  2.  Prediabetes - Bayer DCA Hb A1c Waived  3. Essential hypertension Well controlled on current regimen.  - CBC with Differential/Platelet - CMP14+EGFR - Lipid panel - furosemide (LASIX) 20 MG tablet; Take 1 tablet (20 mg total) by mouth daily.  Dispense: 90 tablet; Refill: 1 - hydrochlorothiazide (HYDRODIURIL) 25 MG tablet; Take 1 tablet (25 mg total) by mouth daily.  Dispense: 90 tablet; Refill: 1  4. Dyslipidemia, goal LDL below 100 - CBC with Differential/Platelet - CMP14+EGFR - Lipid panel  5. Morbid obesity (HCShilohCongratulated on 21 lb weight loss.   Return in about 6 months (around 12/14/2021) for follow-up of chronic medication conditions.     BRLoman BrooklynFNP

## 2021-06-14 LAB — CMP14+EGFR
ALT: 15 IU/L (ref 0–32)
AST: 14 IU/L (ref 0–40)
Albumin/Globulin Ratio: 1.6 (ref 1.2–2.2)
Albumin: 4.6 g/dL (ref 3.8–4.8)
Alkaline Phosphatase: 98 IU/L (ref 44–121)
BUN/Creatinine Ratio: 23 (ref 12–28)
BUN: 18 mg/dL (ref 8–27)
Bilirubin Total: 0.3 mg/dL (ref 0.0–1.2)
CO2: 26 mmol/L (ref 20–29)
Calcium: 9.8 mg/dL (ref 8.7–10.3)
Chloride: 99 mmol/L (ref 96–106)
Creatinine, Ser: 0.79 mg/dL (ref 0.57–1.00)
Globulin, Total: 2.9 g/dL (ref 1.5–4.5)
Glucose: 105 mg/dL — ABNORMAL HIGH (ref 70–99)
Potassium: 3.8 mmol/L (ref 3.5–5.2)
Sodium: 141 mmol/L (ref 134–144)
Total Protein: 7.5 g/dL (ref 6.0–8.5)
eGFR: 83 mL/min/{1.73_m2} (ref >59)

## 2021-06-14 LAB — LIPID PANEL
Chol/HDL Ratio: 3.8 ratio (ref 0.0–4.4)
Cholesterol, Total: 199 mg/dL (ref 100–199)
HDL: 53 mg/dL (ref >39)
LDL Chol Calc (NIH): 123 mg/dL — ABNORMAL HIGH (ref 0–99)
Triglycerides: 131 mg/dL (ref 0–149)
VLDL Cholesterol Cal: 23 mg/dL (ref 5–40)

## 2021-06-14 LAB — CBC WITH DIFFERENTIAL/PLATELET
Basophils Absolute: 0.1 10*3/uL (ref 0.0–0.2)
Basos: 1 %
EOS (ABSOLUTE): 0.4 10*3/uL (ref 0.0–0.4)
Eos: 5 %
Hematocrit: 37.8 % (ref 34.0–46.6)
Hemoglobin: 12.5 g/dL (ref 11.1–15.9)
Immature Grans (Abs): 0.1 10*3/uL (ref 0.0–0.1)
Immature Granulocytes: 1 %
Lymphocytes Absolute: 2.2 10*3/uL (ref 0.7–3.1)
Lymphs: 31 %
MCH: 29.2 pg (ref 26.6–33.0)
MCHC: 33.1 g/dL (ref 31.5–35.7)
MCV: 88 fL (ref 79–97)
Monocytes Absolute: 0.5 10*3/uL (ref 0.1–0.9)
Monocytes: 7 %
Neutrophils Absolute: 3.8 10*3/uL (ref 1.4–7.0)
Neutrophils: 55 %
Platelets: 250 10*3/uL (ref 150–450)
RBC: 4.28 x10E6/uL (ref 3.77–5.28)
RDW: 12.7 % (ref 11.7–15.4)
WBC: 7 10*3/uL (ref 3.4–10.8)

## 2021-06-29 DIAGNOSIS — L97412 Non-pressure chronic ulcer of right heel and midfoot with fat layer exposed: Secondary | ICD-10-CM | POA: Diagnosis not present

## 2021-08-03 DIAGNOSIS — G609 Hereditary and idiopathic neuropathy, unspecified: Secondary | ICD-10-CM | POA: Diagnosis not present

## 2021-09-13 ENCOUNTER — Other Ambulatory Visit: Payer: Self-pay | Admitting: Family Medicine

## 2021-09-13 DIAGNOSIS — E785 Hyperlipidemia, unspecified: Secondary | ICD-10-CM

## 2021-10-12 DIAGNOSIS — G6289 Other specified polyneuropathies: Secondary | ICD-10-CM | POA: Diagnosis not present

## 2021-11-13 ENCOUNTER — Encounter (INDEPENDENT_AMBULATORY_CARE_PROVIDER_SITE_OTHER): Payer: Self-pay

## 2021-12-04 ENCOUNTER — Other Ambulatory Visit: Payer: Self-pay | Admitting: Family Medicine

## 2021-12-04 DIAGNOSIS — E785 Hyperlipidemia, unspecified: Secondary | ICD-10-CM

## 2021-12-15 ENCOUNTER — Ambulatory Visit: Payer: Medicare Other | Admitting: Family Medicine

## 2021-12-15 ENCOUNTER — Ambulatory Visit: Payer: Medicaid Other | Admitting: Family Medicine

## 2021-12-20 ENCOUNTER — Ambulatory Visit: Payer: Medicare Other | Admitting: Family Medicine

## 2021-12-26 ENCOUNTER — Ambulatory Visit (INDEPENDENT_AMBULATORY_CARE_PROVIDER_SITE_OTHER): Payer: Medicare Other

## 2021-12-26 ENCOUNTER — Ambulatory Visit (INDEPENDENT_AMBULATORY_CARE_PROVIDER_SITE_OTHER): Payer: Medicare Other | Admitting: Family Medicine

## 2021-12-26 ENCOUNTER — Telehealth: Payer: Self-pay | Admitting: Family Medicine

## 2021-12-26 ENCOUNTER — Encounter: Payer: Self-pay | Admitting: Family Medicine

## 2021-12-26 VITALS — BP 129/75 | HR 73 | Temp 98.7°F | Ht 75.0 in | Wt 331.6 lb

## 2021-12-26 DIAGNOSIS — E039 Hypothyroidism, unspecified: Secondary | ICD-10-CM | POA: Diagnosis not present

## 2021-12-26 DIAGNOSIS — Z13820 Encounter for screening for osteoporosis: Secondary | ICD-10-CM | POA: Diagnosis not present

## 2021-12-26 DIAGNOSIS — I10 Essential (primary) hypertension: Secondary | ICD-10-CM

## 2021-12-26 DIAGNOSIS — E785 Hyperlipidemia, unspecified: Secondary | ICD-10-CM | POA: Diagnosis not present

## 2021-12-26 DIAGNOSIS — R7303 Prediabetes: Secondary | ICD-10-CM | POA: Diagnosis not present

## 2021-12-26 DIAGNOSIS — Z23 Encounter for immunization: Secondary | ICD-10-CM

## 2021-12-26 DIAGNOSIS — Z780 Asymptomatic menopausal state: Secondary | ICD-10-CM | POA: Diagnosis not present

## 2021-12-26 DIAGNOSIS — Z5320 Procedure and treatment not carried out because of patient's decision for unspecified reasons: Secondary | ICD-10-CM | POA: Insufficient documentation

## 2021-12-26 HISTORY — DX: Procedure and treatment not carried out because of patient's decision for unspecified reasons: Z53.20

## 2021-12-26 LAB — BAYER DCA HB A1C WAIVED: HB A1C (BAYER DCA - WAIVED): 6.1 % — ABNORMAL HIGH (ref 4.8–5.6)

## 2021-12-26 MED ORDER — LEVOTHYROXINE SODIUM 200 MCG PO TABS: 200.0000 ug | ORAL_TABLET | Freq: Every day | ORAL | 1 refills | Status: DC

## 2021-12-26 MED ORDER — HYDROCHLOROTHIAZIDE 25 MG PO TABS: 25.0000 mg | ORAL_TABLET | Freq: Every day | ORAL | 1 refills | Status: DC

## 2021-12-26 NOTE — Progress Notes (Signed)
Subjective:  Patient ID: Dana Koch, female    DOB: [birth year], 66 y.o.   MRN: [id number]  Patient Care Team: Baruch Gouty, FNP as PCP - General (Family Medicine)   Chief Complaint:  Establish Care (Pt states she has no concerns , here for her 6 month f/u former joyce pt )   HPI: Dana Koch is a 66 y.o. female presenting on 12/26/2021 for [place] (Pt states she has no concerns , here for her 6 month f/u former joyce pt )   Pt presents today to establish care with new PCP and for management of chronic medical conditions.   1. Prediabetes Currently on Ozempic weekly and tolerating well. Denies nausea, vomiting, diarrhea, constipation, or GERD. No polyuria, polyphagia, or polydipsia. A1Cs have been in the prediabetes range for several months. Ozempic was started in 11/2020 for prediabetes.   2. Essential hypertension On HCTZ and tolerating well. Denies chest pain, headaches, leg swelling, visual changes, confusion, weakness, palpitations, or syncope. Compliant with medications without associated side effects. Does not follow a diet or exercise routine.   3. Acquired hypothyroidism On T3 and T4 repletion therapy. Tolerating well. Denies hyper- or hypothyroid symptoms. Is followed by endocrinology on a regular basis.   4. Morbid obesity ([place]) Does not follow a diet or exercise routine. Currently on Ozempic and tolerating well.   5. Dyslipidemia, goal LDL below 100 Currently on Zetia as she is unable to tolerate statin therapy. Does not follow a diet or exercise routine.      Relevant past medical, surgical, family, and social history reviewed and updated as indicated.  Allergies and medications reviewed and updated. Data reviewed: Chart in Epic.   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 02/10/2018   [facility] +HPV, will schedule colpo with Dr Glo Herring    Hyperlipidemia     Hypertension    Hypothyroidism    Lymphedema    Neuropathy    Prediabetes    Venous stasis     Past Surgical History:  Procedure Laterality Date   CHOLECYSTECTOMY  01/16/1987   LEG SURGERY Left    broken left leg   TOTAL ABDOMINAL HYSTERECTOMY  2020    Social History   Socioeconomic History   Marital status: Legally Separated    Spouse name: Not on file   Number of children: Not on file   Years of education: Not on file   Highest education level: Not on file  Occupational History   Not on file  Tobacco Use   Smoking status: Former    Packs/day: 3.00    Years: 40.00    Total pack years: 120.00    Types: Cigarettes    Start date: 12/04/1972    Quit date: 01/03/2013    Years since quitting: 8.9   Smokeless tobacco: Never  Vaping Use   Vaping Use: Never used  Substance and Sexual Activity   Alcohol use: No    Alcohol/week: 0.0 standard drinks of alcohol   Drug use: No   Sexual activity: Not Currently    Birth control/protection: Post-menopausal  Other Topics Concern   Not on file  Social History Narrative   Not on file   Social Determinants of Health   Financial Resource Strain: Not on file  Food Insecurity: Not on file  Transportation Needs: Not on file  Physical Activity: Not on file  Stress: Not on file  Social Connections:  Not on file  Intimate Partner Violence: Not on file    Outpatient Encounter Medications as of 12/26/2021  Medication Sig   Cyanocobalamin (B-12) 1000 MCG CAPS Take by mouth.   ezetimibe (ZETIA) 10 MG tablet TAKE ONE TABLET ONCE DAILY   liothyronine (CYTOMEL) 5 MCG tablet Take 5 mcg by mouth daily.   Omega-3 Fatty Acids (FISH OIL) 1000 MG CAPS    Semaglutide (OZEMPIC, 0.25 OR 0.5 MG/DOSE, [place]) Inject into the skin.   [DISCONTINUED] hydrochlorothiazide (HYDRODIURIL) 25 MG tablet Take 1 tablet (25 mg total) by mouth daily.   [DISCONTINUED] levothyroxine (SYNTHROID) 200 MCG tablet TAKE 1 TABLET DAILY   hydrochlorothiazide  (HYDRODIURIL) 25 MG tablet Take 1 tablet (25 mg total) by mouth daily.   [DISCONTINUED] furosemide (LASIX) 20 MG tablet Take 1 tablet (20 mg total) by mouth daily.   [DISCONTINUED] levothyroxine (SYNTHROID) 200 MCG tablet Take 1 tablet (200 mcg total) by mouth daily.   No facility-administered encounter medications on file as of 12/26/2021.    Allergies  Allergen Reactions   Demerol [Meperidine] Other (See Comments)    Stops my heart   Statins Other (See Comments) and Shortness Of Breath    sob   Amoxicillin Hives   Red Yeast Rice [Cholestin] Itching    Review of Systems  Constitutional:  Negative for activity change, appetite change, chills, diaphoresis, fatigue, fever and unexpected weight change.  HENT: Negative.    Eyes: Negative.  Negative for photophobia and visual disturbance.  Respiratory:  Negative for cough, chest tightness and shortness of breath.   Cardiovascular:  Negative for chest pain, palpitations and leg swelling.  Gastrointestinal:  Negative for abdominal pain, blood in stool, constipation, diarrhea, nausea and vomiting.  Endocrine: Negative.  Negative for polydipsia, polyphagia and polyuria.  Genitourinary:  Negative for decreased urine volume, difficulty urinating, dysuria, frequency and urgency.  Musculoskeletal:  Negative for arthralgias and myalgias.  Skin: Negative.   Allergic/Immunologic: Negative.   Neurological:  Negative for dizziness, tremors, seizures, syncope, facial asymmetry, speech difficulty, weakness, light-headedness, numbness and headaches.  Hematological: Negative.   Psychiatric/Behavioral:  Negative for confusion, hallucinations, sleep disturbance and suicidal ideas.   All other systems reviewed and are negative.       Objective:  BP 129/75   Pulse 73   Temp 98.7 F (37.1 C)   Ht _0  (1.905 m)   Wt (!) 331 lb 9.6 oz (150.4 kg)   SpO2 95%   BMI 41.45 kg/m    Wt Readings from Last 3 Encounters:  12/26/21 (!) 331 lb 9.6 oz  (150.4 kg)  06/13/21 (!) 333 lb (151 kg)  03/22/21 (!) 354 lb (160.6 kg)    Physical Exam Vitals and nursing note reviewed.  Constitutional:      General: She is not in acute distress.    Appearance: Normal appearance. She is well-developed and well-groomed. She is morbidly obese. She is not ill-appearing, toxic-appearing or diaphoretic.  HENT:     Head: Normocephalic and atraumatic.     Jaw: There is normal jaw occlusion.     Right Ear: Hearing normal.     Left Ear: Hearing normal.     Nose: Nose normal.     Mouth/Throat:     Lips: Pink.     Mouth: Mucous membranes are moist.     Pharynx: Oropharynx is clear. Uvula midline.  Eyes:     General: Lids are normal.     Extraocular Movements: Extraocular movements intact.     Conjunctiva/sclera:  Conjunctivae normal.     Pupils: Pupils are equal, round, and reactive to light.  Neck:     Thyroid: No thyroid mass, thyromegaly or thyroid tenderness.     Vascular: No carotid bruit or JVD.     Trachea: Trachea and phonation normal.  Cardiovascular:     Rate and Rhythm: Normal rate and regular rhythm.     Chest Wall: PMI is not displaced.     Pulses: Normal pulses.     Heart sounds: Normal heart sounds. No murmur heard.    No friction rub. No gallop.  Pulmonary:     Effort: Pulmonary effort is normal. No respiratory distress.     Breath sounds: Normal breath sounds. No wheezing.  Abdominal:     General: Bowel sounds are normal. There is no distension or abdominal bruit.     Palpations: Abdomen is soft. There is no hepatomegaly or splenomegaly.     Tenderness: There is no abdominal tenderness. There is no right CVA tenderness or left CVA tenderness.     Hernia: No hernia is present.  Musculoskeletal:        General: Normal range of motion.     Cervical back: Normal range of motion and neck supple.     Right lower leg: No edema.     Left lower leg: No edema.  Lymphadenopathy:     Cervical: No cervical adenopathy.  Skin:     General: Skin is warm and dry.     Capillary Refill: Capillary refill takes less than 2 seconds.     Coloration: Skin is not cyanotic, jaundiced or pale.     Findings: No rash.  Neurological:     General: No focal deficit present.     Mental Status: She is alert and oriented to person, place, and time.     Sensory: Sensation is intact.     Motor: Motor function is intact.     Coordination: Coordination is intact.     Gait: Gait is intact.     Deep Tendon Reflexes: Reflexes are normal and symmetric.  Psychiatric:        Attention and Perception: Attention and perception normal.        Mood and Affect: Mood and affect normal.        Speech: Speech normal.        Behavior: Behavior normal. Behavior is cooperative.        Thought Content: Thought content normal.        Cognition and Memory: Cognition and memory normal.        Judgment: Judgment normal.     Results for orders placed or performed in visit on 06/13/21  CBC with Differential/Platelet  Result Value Ref Range   WBC 7.0 3.4 - 10.8 x10E3/uL   RBC 4.28 3.77 - 5.28 x10E6/uL   Hemoglobin 12.5 11.1 - 15.9 g/dL   Hematocrit 37.8 34.0 - 46.6 %   MCV 88 79 - 97 fL   MCH 29.2 26.6 - 33.0 pg   MCHC 33.1 31.5 - 35.7 g/dL   RDW 12.7 11.7 - 15.4 %   Platelets 250 150 - 450 x10E3/uL   Neutrophils 55 Not Estab. %   Lymphs 31 Not Estab. %   Monocytes 7 Not Estab. %   Eos 5 Not Estab. %   Basos 1 Not Estab. %   Neutrophils Absolute 3.8 1.4 - 7.0 x10E3/uL   Lymphocytes Absolute 2.2 0.7 - 3.1 x10E3/uL   Monocytes Absolute 0.5 0.1 -  0.9 x10E3/uL   EOS (ABSOLUTE) 0.4 0.0 - 0.4 x10E3/uL   Basophils Absolute 0.1 0.0 - 0.2 x10E3/uL   Immature Granulocytes 1 Not Estab. %   Immature Grans (Abs) 0.1 0.0 - 0.1 x10E3/uL  CMP14+EGFR  Result Value Ref Range   Glucose 105 (H) 70 - 99 mg/dL   BUN 18 8 - 27 mg/dL   Creatinine, Ser 0.79 0.57 - 1.00 mg/dL   eGFR 83 >59 mL/min/1.73   BUN/Creatinine Ratio 23 12 - 28   Sodium 141 134 - 144  mmol/L   Potassium 3.8 3.5 - 5.2 mmol/L   Chloride 99 96 - 106 mmol/L   CO2 26 20 - 29 mmol/L   Calcium 9.8 8.7 - 10.3 mg/dL   Total Protein 7.5 6.0 - 8.5 g/dL   Albumin 4.6 3.8 - 4.8 g/dL   Globulin, Total 2.9 1.5 - 4.5 g/dL   Albumin/Globulin Ratio 1.6 1.2 - 2.2   Bilirubin Total 0.3 0.0 - 1.2 mg/dL   Alkaline Phosphatase 98 44 - 121 IU/L   AST 14 0 - 40 IU/L   ALT 15 0 - 32 IU/L  Lipid panel  Result Value Ref Range   Cholesterol, Total 199 100 - 199 mg/dL   Triglycerides 131 0 - 149 mg/dL   HDL 53 >39 mg/dL   VLDL Cholesterol Cal 23 5 - 40 mg/dL   LDL Chol Calc (NIH) 123 (H) 0 - 99 mg/dL   Chol/HDL Ratio 3.8 0.0 - 4.4 ratio  Bayer DCA Hb A1c Waived  Result Value Ref Range   HB A1C (BAYER DCA - WAIVED) 5.5 4.8 - 5.6 %       Pertinent labs & imaging results that were available during my care of the patient were reviewed by me and considered in my medical decision making.  Assessment & Plan:  Soffia was seen today for establish care.  Diagnoses and all orders for this visit:  Prediabetes A1C 6.1 in office. Continue current regimen. Diet and exercise encouraged. Other labs pending.  -     Lipid panel -     CBC with Differential/Platelet -     CMP14+EGFR -     Bayer DCA Hb A1c Waived -     Thyroid Panel With TSH -     Microalbumin / creatinine urine ratio  Essential hypertension BP well controlled. Changes were not made in regimen regimen. Goal BP is 130/80. Pt aware to report any persistent high or low readings. DASH diet and exercise encouraged. Exercise at least 150 minutes per week and increase as tolerated. Goal BMI > 25. Stress management encouraged. Avoid nicotine and tobacco product use. Avoid excessive alcohol and NSAID's. Avoid more than 2000 mg of sodium daily. Medications as prescribed. Follow up as scheduled.  -     Lipid panel -     CBC with Differential/Platelet -     CMP14+EGFR -     Bayer DCA Hb A1c Waived -     Thyroid Panel With TSH -      hydrochlorothiazide (HYDRODIURIL) 25 MG tablet; Take 1 tablet (25 mg total) by mouth daily. -     Microalbumin / creatinine urine ratio  Acquired hypothyroidism Thyroid disease has been well controlled. Labs are pending. Adjustments to regimen will be made if warranted. Make sure to take medications on an empty stomach with a full glass of water. Make sure to avoid vitamins or supplements for at least 4 hours before and 4 hours after taking medications. Repeat  labs in 3 months if adjustments are made and in 6 months if stable.   -     Thyroid Panel With TSH -     Discontinue: levothyroxine (SYNTHROID) 200 MCG tablet; Take 1 tablet (200 mcg total) by mouth daily.  Morbid obesity ([place]) Diet and exercise encouraged. Labs pending.  -     Lipid panel -     CBC with Differential/Platelet -     CMP14+EGFR -     Bayer DCA Hb A1c Waived -     Thyroid Panel With TSH  Dyslipidemia, goal LDL below 100 Diet encouraged - increase intake of fresh fruits and vegetables, increase intake of lean proteins. Bake, broil, or grill foods. Avoid fried, greasy, and fatty foods. Avoid fast foods. Increase intake of fiber-rich whole grains. Exercise encouraged - at least 150 minutes per week and advance as tolerated.  Goal BMI < 25. Continue medications as prescribed. Follow up in 3-6 months as discussed.  -     Thyroid Panel With TSH  Screening for osteoporosis DEXA today.  -     DG [facility] DEXA  Lung cancer screening declined by patient Colon cancer screening declined Mammogram declined Pt declined all screenings today.     Continue all other maintenance medications.  Follow up plan: Return in about 6 months (around 06/27/2022), or if symptoms worsen or fail to improve, for CPE.   Continue healthy lifestyle choices, including diet (rich in fruits, vegetables, and lean proteins, and low in salt and simple carbohydrates) and exercise (at least 30 minutes of moderate physical activity daily).  Educational  handout given for health maintenance   The above assessment and management plan was discussed with the patient. The patient verbalized understanding of and has agreed to the management plan. Patient is aware to call the clinic if they develop any new symptoms or if symptoms persist or worsen. Patient is aware when to return to the clinic for a follow-up visit. Patient educated on when it is appropriate to go to the emergency department.   Monia Pouch, FNP-C [facility] [phone]

## 2021-12-26 NOTE — Telephone Encounter (Signed)
PATIENT WOULD LIKE TO CANCEL LABS FOR THRYOID AS SHE DOES NOT NEED THE TYROID LAB- IS THERE A WAY WE CAN CANCEL?

## 2021-12-26 NOTE — Telephone Encounter (Signed)
Pt called in and says that she does not see levothyroxine (SYNTHROID) 200 MCG tablet on her med list on mychart. She wants to let Rakes know that she does see a endocrinologist regularly--DR [facility].

## 2021-12-26 NOTE — Addendum Note (Signed)
Addended by: Sonny Masters on: 12/26/2021 03:40 PM   Modules accepted: Orders

## 2021-12-27 LAB — CBC WITH DIFFERENTIAL/PLATELET
Basophils Absolute: 0.1 10*3/uL (ref 0.0–0.2)
Basos: 1 %
EOS (ABSOLUTE): 0.2 10*3/uL (ref 0.0–0.4)
Eos: 4 %
Hematocrit: 38.2 % (ref 34.0–46.6)
Hemoglobin: 12.4 g/dL (ref 11.1–15.9)
Immature Grans (Abs): 0 10*3/uL (ref 0.0–0.1)
Immature Granulocytes: 1 %
Lymphocytes Absolute: 2.1 10*3/uL (ref 0.7–3.1)
Lymphs: 36 %
MCH: 28.6 pg (ref 26.6–33.0)
MCHC: 32.5 g/dL (ref 31.5–35.7)
MCV: 88 fL (ref 79–97)
Monocytes Absolute: 0.4 10*3/uL (ref 0.1–0.9)
Monocytes: 7 %
Neutrophils Absolute: 3 10*3/uL (ref 1.4–7.0)
Neutrophils: 51 %
Platelets: 232 10*3/uL (ref 150–450)
RBC: 4.34 x10E6/uL (ref 3.77–5.28)
RDW: 12.8 % (ref 11.7–15.4)
WBC: 5.8 10*3/uL (ref 3.4–10.8)

## 2021-12-27 LAB — LIPID PANEL
Chol/HDL Ratio: 3.6 ratio (ref 0.0–4.4)
Cholesterol, Total: 209 mg/dL — ABNORMAL HIGH (ref 100–199)
HDL: 58 mg/dL (ref >39)
LDL Chol Calc (NIH): 127 mg/dL — ABNORMAL HIGH (ref 0–99)
Triglycerides: 135 mg/dL (ref 0–149)
VLDL Cholesterol Cal: 24 mg/dL (ref 5–40)

## 2021-12-27 LAB — CMP14+EGFR
ALT: 15 IU/L (ref 0–32)
AST: 16 IU/L (ref 0–40)
Albumin/Globulin Ratio: 1.6 (ref 1.2–2.2)
Albumin: 4.5 g/dL (ref 3.9–4.9)
Alkaline Phosphatase: 103 IU/L (ref 44–121)
BUN/Creatinine Ratio: 26 (ref 12–28)
BUN: 18 mg/dL (ref 8–27)
Bilirubin Total: 0.3 mg/dL (ref 0.0–1.2)
CO2: 23 mmol/L (ref 20–29)
Calcium: 9.7 mg/dL (ref 8.7–10.3)
Chloride: 100 mmol/L (ref 96–106)
Creatinine, Ser: 0.69 mg/dL (ref 0.57–1.00)
Globulin, Total: 2.9 g/dL (ref 1.5–4.5)
Glucose: 100 mg/dL — ABNORMAL HIGH (ref 70–99)
Potassium: 3.7 mmol/L (ref 3.5–5.2)
Sodium: 141 mmol/L (ref 134–144)
Total Protein: 7.4 g/dL (ref 6.0–8.5)
eGFR: 96 mL/min/{1.73_m2} (ref >59)

## 2021-12-27 LAB — THYROID PANEL WITH TSH
Free Thyroxine Index: 3 (ref 1.2–4.9)
T3 Uptake Ratio: 28 % (ref 24–39)
T4, Total: 10.6 ug/dL (ref 4.5–12.0)
TSH: 3.19 u[IU]/mL (ref 0.450–4.500)

## 2022-01-02 ENCOUNTER — Encounter: Payer: Self-pay | Admitting: Family Medicine

## 2022-01-02 DIAGNOSIS — L84 Corns and callosities: Secondary | ICD-10-CM | POA: Diagnosis not present

## 2022-02-07 DIAGNOSIS — R7303 Prediabetes: Secondary | ICD-10-CM | POA: Diagnosis not present

## 2022-02-07 DIAGNOSIS — I10 Essential (primary) hypertension: Secondary | ICD-10-CM | POA: Diagnosis not present

## 2022-02-07 DIAGNOSIS — E039 Hypothyroidism, unspecified: Secondary | ICD-10-CM | POA: Diagnosis not present

## 2022-02-27 ENCOUNTER — Other Ambulatory Visit: Payer: Self-pay | Admitting: Family Medicine

## 2022-02-27 DIAGNOSIS — E785 Hyperlipidemia, unspecified: Secondary | ICD-10-CM

## 2022-03-05 DIAGNOSIS — M25561 Pain in right knee: Secondary | ICD-10-CM | POA: Diagnosis not present

## 2022-03-05 DIAGNOSIS — M25551 Pain in right hip: Secondary | ICD-10-CM | POA: Diagnosis not present

## 2022-03-20 ENCOUNTER — Telehealth: Payer: Self-pay | Admitting: Family Medicine

## 2022-03-20 NOTE — Telephone Encounter (Signed)
Left detailed message - switch in providers has been approved.  When patient calls back schedule 30 mins with GM

## 2022-04-02 ENCOUNTER — Other Ambulatory Visit: Payer: Self-pay | Admitting: Family Medicine

## 2022-04-02 DIAGNOSIS — M25561 Pain in right knee: Secondary | ICD-10-CM | POA: Diagnosis not present

## 2022-04-02 DIAGNOSIS — I10 Essential (primary) hypertension: Secondary | ICD-10-CM

## 2022-04-02 DIAGNOSIS — E785 Hyperlipidemia, unspecified: Secondary | ICD-10-CM

## 2022-04-09 ENCOUNTER — Ambulatory Visit: Payer: 59 | Attending: Orthopedic Surgery

## 2022-04-09 ENCOUNTER — Other Ambulatory Visit: Payer: Self-pay

## 2022-04-09 DIAGNOSIS — M25551 Pain in right hip: Secondary | ICD-10-CM | POA: Insufficient documentation

## 2022-04-09 DIAGNOSIS — M6281 Muscle weakness (generalized): Secondary | ICD-10-CM | POA: Insufficient documentation

## 2022-04-09 DIAGNOSIS — M79604 Pain in right leg: Secondary | ICD-10-CM | POA: Diagnosis not present

## 2022-04-09 NOTE — Therapy (Signed)
OUTPATIENT PHYSICAL THERAPY LOWER EXTREMITY EVALUATION   Patient Name: Dana Koch MRN: [id number] DOB:1955-11-23, 67 y.o., female Today's Date: 04/09/2022  END OF SESSION:  PT End of Session - 04/09/22 0906     Visit Number 1    Number of Visits 6    Date for PT Re-Evaluation 06/15/22    PT Start Time 0911   Patient arrived late to her appointment.   PT Stop Time 0957    PT Time Calculation (min) 46 min    Activity Tolerance Patient limited by pain    Behavior During Therapy 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 02/10/2018   [facility] +HPV, will schedule colpo with Dr Glo Herring    Hyperlipidemia    Hypertension    Hypothyroidism    Lymphedema    Neuropathy    Prediabetes    Venous stasis    Past Surgical History:  Procedure Laterality Date   CHOLECYSTECTOMY  01/16/1987   LEG SURGERY Left    broken left leg   TOTAL ABDOMINAL HYSTERECTOMY  2020   Patient Active Problem List   Diagnosis Date Noted   Lung cancer screening declined by patient 12/26/2021   Colon cancer screening declined 12/26/2021   Mammogram declined 12/26/2021   Neuropathic ulcer of foot ([place]) 123456   Metabolic syndrome 99991111   Arthritis 05/03/2020   Prediabetes 05/03/2020   Dyslipidemia, goal LDL below 100 03/20/2018   Morbid obesity ([place]) 06/26/2017   Essential hypertension 02/20/2016   Hypothyroidism 02/20/2016   REFERRING PROVIDER: Case, [place], MD  REFERRING DIAG: Right knee pain, unspecified chronicity  THERAPY DIAG:  Pain in right hip  Pain in right leg  Muscle weakness (generalized)  Rationale for Evaluation and Treatment: Rehabilitation  ONSET DATE: 02/26/22  SUBJECTIVE:   SUBJECTIVE STATEMENT: Patient reports that she began having pain on 02/26/22 when she felt a pop in her right knee. Her pain was initially in her right knee, but  the muscles in her thigh are the main thing bothering her right now. She also has a chronic groin injury that still hurts. She has been having difficulty getting comfortable to sleep at night.  PERTINENT HISTORY: HTN, OA, morbid obesity, and neuropathy PAIN:  Are you having pain? Yes: NPRS scale: 8/10 Pain location: right groin and quadriceps Pain description: constant ache with intermittent throbbing Aggravating factors: transfers, walking Relieving factors: none known   PRECAUTIONS: None  WEIGHT BEARING RESTRICTIONS: No  FALLS:  Has patient fallen in last 6 months? No  LIVING ENVIRONMENT: Lives with: lives alone Lives in: House/apartment Stairs: Yes: External: 1 steps; none Has following equipment at home: Single point cane and Walker - 2 wheeled  OCCUPATION: retired  PLOF: Independent  PATIENT GOALS: reduced pain, and be able to walk longer   NEXT MD VISIT: none scheduled  OBJECTIVE:   PATIENT SURVEYS:  FOTO 39.53  COGNITION: Overall cognitive status: Within functional limits for tasks assessed     SENSATION: Patient has neuropathy in bilateral lower extremities  POSTURE: rounded shoulders and forward head  PALPATION: TTP: right knee medial joint line, patellar tendon, lateral hamstring insertion, and greater trochanter   JOINT MOBILITY:  Lumbar spine: hypomobile with L1-2 causing left shoulder burning  LOWER EXTREMITY ROM: unable to be assessed due to pain and positional intolerance to supine  LOWER EXTREMITY MMT:  MMT  Right eval Left eval  Hip flexion 3/5; limited by familiar pain 4-/5  Hip extension    Hip abduction    Hip adduction    Hip internal rotation    Hip external rotation    Knee flexion 4-/5 4/5  Knee extension 4-/5 4/5  Ankle dorsiflexion    Ankle plantarflexion    Ankle inversion    Ankle eversion     (Blank rows = not tested)  LOWER EXTREMITY SPECIAL TESTS:  Unable to be assessed due to increased pain in supine Passive hip  IR and ER reproduced familiar pain   FUNCTIONAL TESTS:  Significant difficulty with sit to stand transfers  GAIT: Assistive device utilized: Single point cane Level of assistance: Modified independence Comments: decreased gait speed, stride length, minimal knee flexion bilaterally, with absent heel strike and toe off   TODAY'S TREATMENT:                                                                                                                              DATE:     PATIENT EDUCATION:  Education details: objective findings, POC, healing, prognosis, and goals for therapy Person educated: Patient Education method: Explanation Education comprehension: verbalized understanding  HOME EXERCISE PROGRAM:   ASSESSMENT:  CLINICAL IMPRESSION: Patient is a 67 y.o. female who was seen today for physical therapy evaluation and treatment for right hip and thigh pain with no known cause. She presented with high pain severity and irritability with right hip flexion manual muscle testing and passive hip internal and external rotation reproducing her familiar symptoms. Her active range of motion was unable to be assessed due to increase pain when in supine. Recommend that she continue with skilled physical therapy to address her impairments to maximize her functional mobility.    OBJECTIVE IMPAIRMENTS: Abnormal gait, decreased activity tolerance, decreased knowledge of condition, decreased mobility, difficulty walking, decreased ROM, decreased strength, hypomobility, postural dysfunction, and pain.   ACTIVITY LIMITATIONS: carrying, lifting, bending, standing, squatting, sleeping, stairs, transfers, and locomotion level  PARTICIPATION LIMITATIONS: meal prep, cleaning, laundry, shopping, and community activity  PERSONAL FACTORS: 3+ comorbidities: HTN, OA, morbid obesity, and neuropathy  are also affecting patient's functional outcome.   REHAB POTENTIAL: Fair    CLINICAL DECISION MAKING:  Evolving/moderate complexity  EVALUATION COMPLEXITY: Moderate   GOALS: Goals reviewed with patient? Yes  LONG TERM GOALS: Target date: 05/07/22  Patient will be independent with her HEP.  Baseline:  Goal status: INITIAL  2.  Patient will be able to complete her daily activities without her familiar pain exceeding 6/10. Baseline:  Goal status: INITIAL  3.  Patient will be able to walk for at least 10 minutes without being limited by her familiar symptoms.  Baseline:  Goal status: INITIAL  4.  Patient will be able to demonstrate at least 4-/5 right hip flexor strength without being limited by her familiar symptoms for improved function with her daily activities. Baseline:  Goal status:  INITIAL  PLAN:  PT FREQUENCY: 1-2x/week  PT DURATION: 4 weeks  PLANNED INTERVENTIONS: Therapeutic exercises, Therapeutic activity, Neuromuscular re-education, Balance training, Gait training, Patient/Family education, Self Care, Joint mobilization, Stair training, Electrical stimulation, Cryotherapy, Moist heat, Vasopneumatic device, Manual therapy, and Re-evaluation  PLAN FOR NEXT SESSION: nustep, light hip AROM and strengthening, and modalities as needed   Darlin Coco, PT 04/09/2022, 3:07 PM

## 2022-04-12 ENCOUNTER — Ambulatory Visit: Payer: 59

## 2022-04-12 DIAGNOSIS — M25551 Pain in right hip: Secondary | ICD-10-CM | POA: Diagnosis not present

## 2022-04-12 DIAGNOSIS — M6281 Muscle weakness (generalized): Secondary | ICD-10-CM

## 2022-04-12 DIAGNOSIS — M79604 Pain in right leg: Secondary | ICD-10-CM | POA: Diagnosis not present

## 2022-04-12 NOTE — Therapy (Signed)
OUTPATIENT PHYSICAL THERAPY LOWER EXTREMITY TREATMENT   Patient Name: Dana Koch MRN: [id number] DOB:1955/10/13, 67 y.o., female Today's Date: 04/12/2022  END OF SESSION:  PT End of Session - 04/12/22 0953     Visit Number 2    Number of Visits 6    Date for PT Re-Evaluation 06/15/22    PT Start Time 0945    PT Stop Time 1027    PT Time Calculation (min) 42 min    Activity Tolerance Patient limited by pain    Behavior During Therapy [facility] for tasks assessed/performed              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 02/10/2018   [facility] +HPV, will schedule colpo with Dr Glo Herring    Hyperlipidemia    Hypertension    Hypothyroidism    Lymphedema    Neuropathy    Prediabetes    Venous stasis    Past Surgical History:  Procedure Laterality Date   CHOLECYSTECTOMY  01/16/1987   LEG SURGERY Left    broken left leg   TOTAL ABDOMINAL HYSTERECTOMY  2020   Patient Active Problem List   Diagnosis Date Noted   Lung cancer screening declined by patient 12/26/2021   Colon cancer screening declined 12/26/2021   Mammogram declined 12/26/2021   Neuropathic ulcer of foot ([place]) [id number]   Metabolic syndrome [id number]   Arthritis 05/03/2020   Prediabetes 05/03/2020   Dyslipidemia, goal LDL below 100 03/20/2018   Morbid obesity ([place]) 06/26/2017   Essential hypertension 02/20/2016   Hypothyroidism 02/20/2016   REFERRING PROVIDER: Case, [place], MD  REFERRING DIAG: Right knee pain, unspecified chronicity  THERAPY DIAG:  Pain in right hip  Pain in right leg  Muscle weakness (generalized)  Rationale for Evaluation and Treatment: Rehabilitation  ONSET DATE: 02/26/22  SUBJECTIVE:   SUBJECTIVE STATEMENT: Patient reports that she slept really well after her last appointment. She had felt better until this morning. She feels it may be due to the weather.   PERTINENT HISTORY: HTN,  OA, morbid obesity, and neuropathy PAIN:  Are you having pain? Yes: NPRS scale: 5.5/10 Pain location: right groin and quadriceps Pain description: constant ache with intermittent throbbing Aggravating factors: transfers, walking Relieving factors: none known   PRECAUTIONS: None  WEIGHT BEARING RESTRICTIONS: No  FALLS:  Has patient fallen in last 6 months? No  LIVING ENVIRONMENT: Lives with: lives alone Lives in: House/apartment Stairs: Yes: External: 1 steps; none Has following equipment at home: Single point cane and Walker - 2 wheeled  OCCUPATION: retired  PLOF: Independent  PATIENT GOALS: reduced pain, and be able to walk longer   NEXT MD VISIT: none scheduled  OBJECTIVE:   PATIENT SURVEYS:  FOTO 39.53  COGNITION: Overall cognitive status: Within functional limits for tasks assessed     SENSATION: Patient has neuropathy in bilateral lower extremities  POSTURE: rounded shoulders and forward head  PALPATION: TTP: right knee medial joint line, patellar tendon, lateral hamstring insertion, and greater trochanter   JOINT MOBILITY:  Lumbar spine: hypomobile with L1-2 causing left shoulder burning  LOWER EXTREMITY ROM: unable to be assessed due to pain and positional intolerance to supine  LOWER EXTREMITY MMT:  MMT Right eval Left eval  Hip flexion 3/5; limited by familiar pain 4-/5  Hip extension    Hip abduction    Hip adduction    Hip internal rotation    Hip external rotation  Knee flexion 4-/5 4/5  Knee extension 4-/5 4/5  Ankle dorsiflexion    Ankle plantarflexion    Ankle inversion    Ankle eversion     (Blank rows = not tested)  LOWER EXTREMITY SPECIAL TESTS:  Unable to be assessed due to increased pain in supine Passive hip IR and ER reproduced familiar pain   FUNCTIONAL TESTS:  Significant difficulty with sit to stand transfers  GAIT: Assistive device utilized: Single point cane Level of assistance: Modified independence Comments:  decreased gait speed, stride length, minimal knee flexion bilaterally, with absent heel strike and toe off   TODAY'S TREATMENT:                                                                                                                              DATE:                                     3/28 EXERCISE LOG  Exercise Repetitions and Resistance Comments  Seated hip ADD isometric  2 minutes w/ 5 second hold    LAQ 2 minutes    Seated clams  Attempted, but limited due to pain   Seated hip ABD isometric  3 minutes w/ 5 second hold   Nustep  L3 x 10 minutes   Glute set 3 minutes w/ 5 second         Blank cell = exercise not performed today   PATIENT EDUCATION:  Education details: objective findings, POC, healing, prognosis, and goals for therapy Person educated: Patient Education method: Explanation Education comprehension: verbalized understanding  HOME EXERCISE PROGRAM: [id number]  ASSESSMENT:  CLINICAL IMPRESSION: Patient was introduced to multiple new interventions for improved hip mobility and strength without aggravating her familiar symptoms. He required minimal cueing with today's new interventions for proper biomechanics and pacing. She experienced a moderate increase in her familiar symptoms when seated clams were attempted, but this was able to be reduced when this activity was modified to isometric hip abduction. She reported feeling sore upon the conclusion of treatment. She continues to require skilled physical therapy to address her remaining impairments to maximize her functional mobility.   OBJECTIVE IMPAIRMENTS: Abnormal gait, decreased activity tolerance, decreased knowledge of condition, decreased mobility, difficulty walking, decreased ROM, decreased strength, hypomobility, postural dysfunction, and pain.   ACTIVITY LIMITATIONS: carrying, lifting, bending, standing, squatting, sleeping, stairs, transfers, and locomotion level  PARTICIPATION LIMITATIONS: meal  prep, cleaning, laundry, shopping, and community activity  PERSONAL FACTORS: 3+ comorbidities: HTN, OA, morbid obesity, and neuropathy  are also affecting patient's functional outcome.   REHAB POTENTIAL: Fair    CLINICAL DECISION MAKING: Evolving/moderate complexity  EVALUATION COMPLEXITY: Moderate   GOALS: Goals reviewed with patient? Yes  LONG TERM GOALS: Target date: 05/07/22  Patient will be independent with her HEP.  Baseline:  Goal status: INITIAL  2.  Patient will be able  to complete her daily activities without her familiar pain exceeding 6/10. Baseline:  Goal status: INITIAL  3.  Patient will be able to walk for at least 10 minutes without being limited by her familiar symptoms.  Baseline:  Goal status: INITIAL  4.  Patient will be able to demonstrate at least 4-/5 right hip flexor strength without being limited by her familiar symptoms for improved function with her daily activities. Baseline:  Goal status: INITIAL  PLAN:  PT FREQUENCY: 1-2x/week  PT DURATION: 4 weeks  PLANNED INTERVENTIONS: Therapeutic exercises, Therapeutic activity, Neuromuscular re-education, Balance training, Gait training, Patient/Family education, Self Care, Joint mobilization, Stair training, Electrical stimulation, Cryotherapy, Moist heat, Vasopneumatic device, Manual therapy, and Re-evaluation  PLAN FOR NEXT SESSION: nustep, light hip AROM and strengthening, and modalities as needed   Darlin Coco, PT 04/12/2022, 5:18 PM

## 2022-04-16 ENCOUNTER — Ambulatory Visit: Payer: 59 | Attending: Orthopedic Surgery

## 2022-04-16 DIAGNOSIS — M25551 Pain in right hip: Secondary | ICD-10-CM | POA: Insufficient documentation

## 2022-04-16 DIAGNOSIS — M79604 Pain in right leg: Secondary | ICD-10-CM | POA: Insufficient documentation

## 2022-04-16 DIAGNOSIS — M6281 Muscle weakness (generalized): Secondary | ICD-10-CM | POA: Insufficient documentation

## 2022-04-16 NOTE — Therapy (Signed)
OUTPATIENT PHYSICAL THERAPY LOWER EXTREMITY TREATMENT   Patient Name: Dana Koch MRN: [id number] DOB:[birth year], 67 y.o., female Today's Date: 04/16/2022  END OF SESSION:  PT End of Session - 04/16/22 0858     Visit Number 3    Number of Visits 6    Date for PT Re-Evaluation 06/15/22    PT Start Time 0900    PT Stop Time [id number]    PT Time Calculation (min) 41 min    Activity Tolerance Patient tolerated treatment well    Behavior During Therapy [facility] for tasks assessed/performed              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 02/10/2018   [facility] +HPV, will schedule colpo with Dr Glo Herring    Hyperlipidemia    Hypertension    Hypothyroidism    Lymphedema    Neuropathy    Prediabetes    Venous stasis    Past Surgical History:  Procedure Laterality Date   CHOLECYSTECTOMY  01/16/1987   LEG SURGERY Left    broken left leg   TOTAL ABDOMINAL HYSTERECTOMY  2020   Patient Active Problem List   Diagnosis Date Noted   Lung cancer screening declined by patient 12/26/2021   Colon cancer screening declined 12/26/2021   Mammogram declined 12/26/2021   Neuropathic ulcer of foot [id number]   Metabolic syndrome [id number]   Arthritis 05/03/2020   Prediabetes 05/03/2020   Dyslipidemia, goal LDL below 100 03/20/2018   Morbid obesity 06/26/2017   Essential hypertension 02/20/2016   Hypothyroidism 02/20/2016   REFERRING PROVIDER: Case, [place], MD  REFERRING DIAG: Right knee pain, unspecified chronicity  THERAPY DIAG:  Pain in right hip  Pain in right leg  Muscle weakness (generalized)  Rationale for Evaluation and Treatment: Rehabilitation  ONSET DATE: 02/26/22  SUBJECTIVE:   SUBJECTIVE STATEMENT: Patient reports that she is hurting quite a bit this morning after doing her HEP. She notes that it feels good while she does it.   PERTINENT HISTORY: HTN, OA, morbid obesity, and  neuropathy PAIN:  Are you having pain? Yes: NPRS scale: 7/10 Pain location: right groin and quadriceps Pain description: constant ache with intermittent throbbing Aggravating factors: transfers, walking Relieving factors: none known   PRECAUTIONS: None  WEIGHT BEARING RESTRICTIONS: No  FALLS:  Has patient fallen in last 6 months? No  LIVING ENVIRONMENT: Lives with: lives alone Lives in: House/apartment Stairs: Yes: External: 1 steps; none Has following equipment at home: Single point cane and Walker - 2 wheeled  OCCUPATION: retired  PLOF: Independent  PATIENT GOALS: reduced pain, and be able to walk longer   NEXT MD VISIT: none scheduled  OBJECTIVE:   PATIENT SURVEYS:  FOTO 39.53  COGNITION: Overall cognitive status: Within functional limits for tasks assessed     SENSATION: Patient has neuropathy in bilateral lower extremities  POSTURE: rounded shoulders and forward head  PALPATION: TTP: right knee medial joint line, patellar tendon, lateral hamstring insertion, and greater trochanter   JOINT MOBILITY:  Lumbar spine: hypomobile with L1-2 causing left shoulder burning  LOWER EXTREMITY ROM: unable to be assessed due to pain and positional intolerance to supine  LOWER EXTREMITY MMT:  MMT Right eval Left eval  Hip flexion 3/5; limited by familiar pain 4-/5  Hip extension    Hip abduction    Hip adduction    Hip internal rotation    Hip external rotation    Knee flexion  4-/5 4/5  Knee extension 4-/5 4/5  Ankle dorsiflexion    Ankle plantarflexion    Ankle inversion    Ankle eversion     (Blank rows = not tested)  LOWER EXTREMITY SPECIAL TESTS:  Unable to be assessed due to increased pain in supine Passive hip IR and ER reproduced familiar pain   FUNCTIONAL TESTS:  Significant difficulty with sit to stand transfers  GAIT: Assistive device utilized: Single point cane Level of assistance: Modified independence Comments: decreased gait speed,  stride length, minimal knee flexion bilaterally, with absent heel strike and toe off   TODAY'S TREATMENT:                                                                                                                              DATE:                                     4/1 EXERCISE LOG  Exercise Repetitions and Resistance Comments  Nustep  L1 x 15 minutes   Seated clams  3 minutes   LAQ 20 reps each    Seated HS stretch 3 x 30 seconds   Standing heel raise  Unable to complete due to previous injury   Standing toe raise  15 reps    Standing hip ABD  10 reps    Blank cell = exercise not performed today                                    3/28 EXERCISE LOG  Exercise Repetitions and Resistance Comments  Seated hip ADD isometric  2 minutes w/ 5 second hold    LAQ 2 minutes    Seated clams  Attempted, but limited due to pain   Seated hip ABD isometric  3 minutes w/ 5 second hold   Nustep  L3 x 10 minutes   Glute set 3 minutes w/ 5 second         Blank cell = exercise not performed today   PATIENT EDUCATION:  Education details: objective findings, POC, healing, prognosis, and goals for therapy Person educated: Patient Education method: Explanation Education comprehension: verbalized understanding  HOME EXERCISE PROGRAM: [id number]  ASSESSMENT:  CLINICAL IMPRESSION: Patient was introduced to multiple new interventions for improved pain free mobility. She required minimal cueing with today's new interventions for proper exercise . She reported feeling alright upon the conclusion of treatment, but noted that she would probably be sore later. She continues to require skilled physical therapy to address her remaining impairments to maximize her functional mobility.   OBJECTIVE IMPAIRMENTS: Abnormal gait, decreased activity tolerance, decreased knowledge of condition, decreased mobility, difficulty walking, decreased ROM, decreased strength, hypomobility, postural dysfunction, and  pain.   ACTIVITY LIMITATIONS: carrying, lifting, bending, standing, squatting, sleeping, stairs, transfers, and  locomotion level  PARTICIPATION LIMITATIONS: meal prep, cleaning, laundry, shopping, and community activity  PERSONAL FACTORS: 3+ comorbidities: HTN, OA, morbid obesity, and neuropathy  are also affecting patient's functional outcome.   REHAB POTENTIAL: Fair    CLINICAL DECISION MAKING: Evolving/moderate complexity  EVALUATION COMPLEXITY: Moderate   GOALS: Goals reviewed with patient? Yes  LONG TERM GOALS: Target date: 05/07/22  Patient will be independent with her HEP.  Baseline:  Goal status: INITIAL  2.  Patient will be able to complete her daily activities without her familiar pain exceeding 6/10. Baseline:  Goal status: INITIAL  3.  Patient will be able to walk for at least 10 minutes without being limited by her familiar symptoms.  Baseline:  Goal status: INITIAL  4.  Patient will be able to demonstrate at least 4-/5 right hip flexor strength without being limited by her familiar symptoms for improved function with her daily activities. Baseline:  Goal status: INITIAL  PLAN:  PT FREQUENCY: 1-2x/week  PT DURATION: 4 weeks  PLANNED INTERVENTIONS: Therapeutic exercises, Therapeutic activity, Neuromuscular re-education, Balance training, Gait training, Patient/Family education, Self Care, Joint mobilization, Stair training, Electrical stimulation, Cryotherapy, Moist heat, Vasopneumatic device, Manual therapy, and Re-evaluation  PLAN FOR NEXT SESSION: nustep, light hip AROM and strengthening, and modalities as needed   Darlin Coco, PT 04/16/2022, 12:34 PM

## 2022-04-19 ENCOUNTER — Ambulatory Visit: Payer: 59

## 2022-04-23 ENCOUNTER — Ambulatory Visit: Payer: 59

## 2022-04-23 DIAGNOSIS — M25551 Pain in right hip: Secondary | ICD-10-CM | POA: Diagnosis not present

## 2022-04-23 DIAGNOSIS — M6281 Muscle weakness (generalized): Secondary | ICD-10-CM

## 2022-04-23 DIAGNOSIS — M79604 Pain in right leg: Secondary | ICD-10-CM | POA: Diagnosis not present

## 2022-04-23 NOTE — Therapy (Signed)
OUTPATIENT PHYSICAL THERAPY LOWER EXTREMITY TREATMENT   Patient Name: Dana PayorLeslie Brackins MRN: [id number] DOB:Jan 28, 1955, 67 y.o., female Today's Date: 04/23/2022  END OF SESSION:  PT End of Session - 04/23/22 0941     Visit Number 4    Number of Visits 6    Date for PT Re-Evaluation 06/15/22    PT Start Time 0900    PT Stop Time 0944    PT Time Calculation (min) 44 min    Activity Tolerance Patient tolerated treatment well    [facility]per[facility]               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 02/10/2018   [facility] will schedule colpo with Dr Emelda FearFerguson    Hyperlipidemia    Hypertension    Hypothyroidism    Lymphedema    Neuropathy    Prediabetes    Venous stasis    Past Surgical History:  Procedure Laterality Date   CHOLECYSTECTOMY  01/16/1987   LEG SURGERY Left    broken left leg   TOTAL ABDOMINAL HYSTERECTOMY  2020   Patient Active Problem List   Diagnosis Date Noted   Lung cancer screening declined by patient 12/26/2021   Colon cancer screening declined 12/26/2021   Mammogram declined 12/26/2021   Neuropathic ulcer of foot 11/17/2020   Metabolic syndrome 05/03/2020   Arthritis 05/03/2020   Prediabetes 05/03/2020   Dyslipidemia, goal LDL below 100 03/20/2018   Morbid obesity 06/26/2017   Essential hypertension 02/20/2016   Hypothyroidism 02/20/2016   REFERRING PROVIDER: Case, [place], MD  REFERRING DIAG: Right knee pain, unspecified chronicity  THERAPY DIAG:  Pain in right hip  Pain in right leg  Muscle weakness (generalized)  Rationale for Evaluation and Treatment: Rehabilitation  ONSET DATE: 02/26/22  SUBJECTIVE:   SUBJECTIVE STATEMENT: Patient reports that she is really hurting today. She notes that the last appointment "kicked my butt." She feels that her pain is getting worse.   PERTINENT HISTORY: HTN, OA,  morbid obesity, and neuropathy PAIN:  Are you having pain? Yes: NPRS scale: 7.5/10 Pain location: right groin and quadriceps Pain description: constant ache with intermittent throbbing Aggravating factors: transfers, walking Relieving factors: none known   PRECAUTIONS: None  WEIGHT BEARING RESTRICTIONS: No  FALLS:  Has patient fallen in last 6 months? No  LIVING ENVIRONMENT: Lives with: lives alone Lives in: House/apartment Stairs: Yes: External: 1 steps; none Has following equipment at home: Single point cane and Walker - 2 wheeled  OCCUPATION: retired  PLOF: Independent  PATIENT GOALS: reduced pain, and be able to walk longer   NEXT MD VISIT: none scheduled  OBJECTIVE:   PATIENT SURVEYS:  FOTO 39.53  COGNITION: Overall cognitive status: Within functional limits for tasks assessed     SENSATION: Patient has neuropathy in bilateral lower extremities  POSTURE: rounded shoulders and forward head  PALPATION: TTP: right knee medial joint line, patellar tendon, lateral hamstring insertion, and greater trochanter   JOINT MOBILITY:  Lumbar spine: hypomobile with L1-2 causing left shoulder burning  LOWER EXTREMITY ROM: unable to be assessed due to pain and positional intolerance to supine  LOWER EXTREMITY MMT:  MMT Right eval Left eval  Hip flexion 3/5; limited by familiar pain 4-/5  Hip extension    Hip abduction    Hip adduction    Hip internal rotation    Hip external rotation    Knee  flexion 4-/5 4/5  Knee extension 4-/5 4/5  Ankle dorsiflexion    Ankle plantarflexion    Ankle inversion    Ankle eversion     (Blank rows = not tested)  LOWER EXTREMITY SPECIAL TESTS:  Unable to be assessed due to increased pain in supine Passive hip IR and ER reproduced familiar pain   FUNCTIONAL TESTS:  Significant difficulty with sit to stand transfers  GAIT: Assistive device utilized: Single point cane Level of assistance: Modified independence Comments:  decreased gait speed, stride length, minimal knee flexion bilaterally, with absent heel strike and toe off   TODAY'S TREATMENT:                                                                                                                              DATE:                                     4/8 EXERCISE LOG  Exercise Repetitions and Resistance Comments  Nustep  L1 x 20 minutes   LAQ 20 reps each    Seated hip ADD isometric  3 minutes w/ 5 seconds   Seated hip clam 2.5 minutes         Blank cell = exercise not performed today                                    4/1 EXERCISE LOG  Exercise Repetitions and Resistance Comments  Nustep  L1 x 15 minutes   Seated clams  3 minutes   LAQ 20 reps each    Seated HS stretch 3 x 30 seconds   Standing heel raise  Unable to complete due to previous injury   Standing toe raise  15 reps    Standing hip ABD  10 reps    Blank cell = exercise not performed today                                    3/28 EXERCISE LOG  Exercise Repetitions and Resistance Comments  Seated hip ADD isometric  2 minutes w/ 5 second hold    LAQ 2 minutes    Seated clams  Attempted, but limited due to pain   Seated hip ABD isometric  3 minutes w/ 5 second hold   Nustep  L3 x 10 minutes   Glute set 3 minutes w/ 5 second         Blank cell = exercise not performed today   PATIENT EDUCATION:  Education details: OA, benefits of exercise, x-ray vs MRI Person educated: Patient Education method: Explanation Education comprehension: verbalized understanding  HOME EXERCISE PROGRAM: [id number]  ASSESSMENT:  CLINICAL IMPRESSION: Treatment focused on familiar interventions for reduced pain  and improved mobility. She required minimal cueing with today's interventions for proper pacing to avoid aggravating her familiar pain. She was educated on her symptoms and the benefits of movement. She reported feeling a little better upon the conclusion of treatment. She may require  additional medical intervention due to high pain severity and irritability.   OBJECTIVE IMPAIRMENTS: Abnormal gait, decreased activity tolerance, decreased knowledge of condition, decreased mobility, difficulty walking, decreased ROM, decreased strength, hypomobility, postural dysfunction, and pain.   ACTIVITY LIMITATIONS: carrying, lifting, bending, standing, squatting, sleeping, stairs, transfers, and locomotion level  PARTICIPATION LIMITATIONS: meal prep, cleaning, laundry, shopping, and community activity  PERSONAL FACTORS: 3+ comorbidities: HTN, OA, morbid obesity, and neuropathy  are also affecting patient's functional outcome.   REHAB POTENTIAL: Fair    CLINICAL DECISION MAKING: Evolving/moderate complexity  EVALUATION COMPLEXITY: Moderate   GOALS: Goals reviewed with patient? Yes  LONG TERM GOALS: Target date: 05/07/22  Patient will be independent with her HEP.  Baseline:  Goal status: INITIAL  2.  Patient will be able to complete her daily activities without her familiar pain exceeding 6/10. Baseline:  Goal status: INITIAL  3.  Patient will be able to walk for at least 10 minutes without being limited by her familiar symptoms.  Baseline:  Goal status: INITIAL  4.  Patient will be able to demonstrate at least 4-/5 right hip flexor strength without being limited by her familiar symptoms for improved function with her daily activities. Baseline:  Goal status: INITIAL  PLAN:  PT FREQUENCY: 1-2x/week  PT DURATION: 4 weeks  PLANNED INTERVENTIONS: Therapeutic exercises, Therapeutic activity, Neuromuscular re-education, Balance training, Gait training, Patient/Family education, Self Care, Joint mobilization, Stair training, Electrical stimulation, Cryotherapy, Moist heat, Vasopneumatic device, Manual therapy, and Re-evaluation  PLAN FOR NEXT SESSION: nustep, light hip AROM and strengthening, and modalities as needed   Granville Lewis, PT 04/23/2022, 10:18 AM

## 2022-04-24 ENCOUNTER — Ambulatory Visit (INDEPENDENT_AMBULATORY_CARE_PROVIDER_SITE_OTHER): Payer: 59 | Admitting: Family Medicine

## 2022-04-24 ENCOUNTER — Encounter: Payer: Self-pay | Admitting: Family Medicine

## 2022-04-24 VITALS — BP 137/68 | HR 76 | Temp 98.7°F | Ht 75.0 in | Wt 338.0 lb

## 2022-04-24 DIAGNOSIS — I10 Essential (primary) hypertension: Secondary | ICD-10-CM

## 2022-04-24 DIAGNOSIS — R2242 Localized swelling, mass and lump, left lower limb: Secondary | ICD-10-CM | POA: Diagnosis not present

## 2022-04-24 DIAGNOSIS — E039 Hypothyroidism, unspecified: Secondary | ICD-10-CM | POA: Diagnosis not present

## 2022-04-24 DIAGNOSIS — E785 Hyperlipidemia, unspecified: Secondary | ICD-10-CM | POA: Diagnosis not present

## 2022-04-24 DIAGNOSIS — M25561 Pain in right knee: Secondary | ICD-10-CM

## 2022-04-24 DIAGNOSIS — R7303 Prediabetes: Secondary | ICD-10-CM

## 2022-04-24 DIAGNOSIS — M1990 Unspecified osteoarthritis, unspecified site: Secondary | ICD-10-CM

## 2022-04-24 DIAGNOSIS — Z6841 Body Mass Index (BMI) 40.0 and over, adult: Secondary | ICD-10-CM

## 2022-04-24 LAB — LIPID PANEL

## 2022-04-24 MED ORDER — HYDROCHLOROTHIAZIDE 25 MG PO TABS: 25.0000 mg | ORAL_TABLET | Freq: Every day | ORAL | 0 refills | Status: DC

## 2022-04-24 MED ORDER — EZETIMIBE 10 MG PO TABS: 10.0000 mg | ORAL_TABLET | Freq: Every day | ORAL | 0 refills | Status: DC

## 2022-04-24 NOTE — Progress Notes (Signed)
New Patient Office Visit  Subjective   Patient ID: Dana Koch, female    DOB: 1955-04-23  Age: 67 y.o. MRN: [id number]  CC:  Chief Complaint  Patient presents with   Establish Care   HPI Dana Koch presents to establish care Primary hypertension Has a BP cuff for her wrist.  Takes her BP throughout the day and it is "very good"  States it is "normal"  with SBP in 120s.  Denies changes to vision, chest pain, palpitations, shortness of breath   Acquired hypothyroidism Prediabetes Patient is seen by [facility] for Endocrinology and feels that she is well managed. She does not wish to change her regimen Endorses constipation, states she isn't drinking as much coffee than she was States that she is seen every 3 months  Is not currently on ozempic   Morbid obesity Dyslipidemia, goal LDL below 100 Hyperlipidemia:  lower extremity edema and poor exercise tolerance.  Continues to take zetia  No compliance issues with medication. Compliance issues with diet and exercise   Pain, R. Knee  Working with PT for Right knee  Previously seen by Dr. Case in February and was told that it was OA and would like referral to someone else  Does not believe that PT is imrpoving pain Immobility relieves pain, movement increases it.  Started middle of February and is impacting her sleep Is using a walker at home and a cane in public  Rates pain 0/10 while sitting 10/10 while moving   Outpatient Encounter Medications as of 04/24/2022  Medication Sig   Cyanocobalamin (B-12) 1000 MCG CAPS Take by mouth.   ezetimibe (ZETIA) 10 MG tablet TAKE ONE TABLET ONCE DAILY   hydrochlorothiazide (HYDRODIURIL) 25 MG tablet TAKE ONE TABLET BY MOUTH DAILY   levothyroxine (SYNTHROID) 200 MCG tablet Take 1 tablet (200 mcg total) by mouth daily.   liothyronine (CYTOMEL) 5 MCG tablet Take 5 mcg by mouth daily.   Omega-3 Fatty Acids (FISH OIL) 1000 MG CAPS    [DISCONTINUED] Semaglutide (OZEMPIC, 0.25 OR 0.5  MG/DOSE, [place]) Inject into the skin.   No facility-administered encounter medications on file as of 04/24/2022.    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 02/10/2018   [facility] +HPV, will schedule colpo with Dr Emelda Fear    Hyperlipidemia    Hypertension    Hypothyroidism    Lymphedema    Neuropathy    Prediabetes    Venous stasis     Past Surgical History:  Procedure Laterality Date   CHOLECYSTECTOMY  01/16/1987   LEG SURGERY Left    broken left leg   TOTAL ABDOMINAL HYSTERECTOMY  2020    Family History  Problem Relation Age of Onset   Thyroid disease Mother    Bone cancer Mother    Thyroid disease Father    Emphysema Father    Diabetes Father    Neuropathy Father    Diabetes Brother    Thyroid disease Brother    Thyroid disease Sister     Social History   Socioeconomic History   Marital status: Legally Separated    Spouse name: Not on file   Number of children: Not on file   Years of education: Not on file   Highest education level: 12th grade  Occupational History   Not on file  Tobacco Use   Smoking status: Former    Packs/day: 3.00    Years: 40.00  Additional pack years: 0.00    Total pack years: 120.00    Types: Cigarettes    Start date: 12/04/1972    Quit date: 01/03/2013    Years since quitting: 9.3   Smokeless tobacco: Never  Vaping Use   Vaping Use: Never used  Substance and Sexual Activity   Alcohol use: No    Alcohol/week: 0.0 standard drinks of alcohol   Drug use: No   Sexual activity: Not Currently    Birth control/protection: Post-menopausal  Other Topics Concern   Not on file  Social History Narrative   Not on file   Social Determinants of Health   Financial Resource Strain: Low Risk  (04/23/2022)   Overall Financial Resource Strain (CARDIA)    Difficulty of Paying Living Expenses: Not very hard  Food Insecurity: No Food Insecurity (04/23/2022)    Hunger Vital Sign    Worried About Running Out of Food in the Last Year: Never true    Ran Out of Food in the Last Year: Never true  Transportation Needs: No Transportation Needs (04/23/2022)   PRAPARE - Administrator, Civil Service (Medical): No    Lack of Transportation (Non-Medical): No  Physical Activity: Unknown (04/23/2022)   Exercise Vital Sign    Days of Exercise per Week: Patient declined    Minutes of Exercise per Session: Not on file  Stress: No Stress Concern Present (04/23/2022)   [employer] of Occupational Health - Occupational Stress Questionnaire    Feeling of Stress : Only a little  Social Connections: Unknown (04/23/2022)   Social Connection and Isolation Panel [NHANES]    Frequency of Communication with Friends and Family: More than three times a week    Frequency of Social Gatherings with Friends and Family: Three times a week    Attends Religious Services: Patient declined    Active Member of Clubs or Organizations: No    Attends Engineer, structural: Not on file    Marital Status: Separated  Intimate Partner Violence: Not on file    ROS As Per HPI   Objective   BP 137/68   Pulse 76   Temp 98.7 F (37.1 C)   Ht 6\' 3"  (1.905 m)   Wt (!) 338 lb (153.3 kg)   SpO2 92%   BMI 42.25 kg/m   Physical Exam Constitutional:      General: She is not in acute distress.    Appearance: Normal appearance. She is not ill-appearing, toxic-appearing or diaphoretic.  Cardiovascular:     Rate and Rhythm: Normal rate and regular rhythm.     Pulses:          Posterior tibial pulses are 2+ on the right side and 1+ on the left side.     Heart sounds: Normal heart sounds. No murmur heard.    No gallop.  Pulmonary:     Effort: Pulmonary effort is normal. No respiratory distress.     Breath sounds: Normal breath sounds. No stridor. No wheezing, rhonchi or rales.  Abdominal:     General: Abdomen is flat. Bowel sounds are normal. There is no distension.      Palpations: Abdomen is soft. There is no mass.     Tenderness: There is no abdominal tenderness. There is no guarding or rebound.     Hernia: No hernia is present.  Musculoskeletal:     Right knee: Decreased range of motion.     Left lower leg: Swelling present. No deformity. 2+ Edema  present.     Comments: Erythematous left lower leg with   Skin:    General: Skin is warm.     Capillary Refill: Capillary refill takes less than 2 seconds.       Neurological:     General: No focal deficit present.     Mental Status: She is alert and oriented to person, place, and time. Mental status is at baseline.     Motor: No weakness.  Psychiatric:        Mood and Affect: Mood normal.        Behavior: Behavior normal.        Thought Content: Thought content normal.        Judgment: Judgment normal.    Assessment & Plan:  1. Primary hypertension Labs as below. Will communicate results to patient once available.  Elevated BP today in office. Discussed with patient monitoring BP at home. Instructed pt to take BP first thing in the morning after sitting for 5 minutes with feet flat on the floor, arm at heart level. Pt verbalized they have access to BP cuff. Will review measurements with patient at follow up and determine plan for BP management.  - CBC with Differential/Platelet - CMP14+EGFR - hydrochlorothiazide (HYDRODIURIL) 25 MG tablet; Take 1 tablet (25 mg total) by mouth daily.  Dispense: 90 tablet; Refill: 0  2. Acquired hypothyroidism Managed by Endocrinology. Patient requests that endocrinology manages all aspects of care including labs.   3. Morbid obesity Discussed with patient to continue healthy lifestyle choices, including diet (rich in fruits, vegetables, and lean proteins, and low in salt and simple carbohydrates) and exercise (at least 30 minutes of moderate physical activity daily). Limit beverages high is sugar. Recommended at least 80-100 oz of water daily.   4.  Dyslipidemia, goal LDL below 100 Labs as below. Will communicate results to patient once available.  Fasting  Not at goal. Continue current regimen until results are available.  - Lipid panel - ezetimibe (ZETIA) 10 MG tablet; Take 1 tablet (10 mg total) by mouth daily.  Dispense: 90 tablet; Refill: 0  5. Prediabetes Managed by Endocrinology. Patient requests that endocrinology manages all aspects of care including labs.   6. Arthritis 7. Acute pain of right knee Referral placed for orthopedic surgery per patient request as she would like a second opinion. Patient continues with PT and continues to use assistive devices. Imaging from [facility] 03/05/22 states  "1.  No acute fracture.  2.  No joint effusion.  3.  Mild lateral patellar tilt and translation.  4.  Tricompartmental degenerative changes, which are moderate in the lateral and patellofemoral compartments, and mild in the medial compartment." - Ambulatory referral to Orthopedic Surgery  8. Swelling of left lower extremity Patient had previous surgery on left tibia/fibula and states that she has had swelling and erythema since then. Per patient she has not had vascular evaluation of leg. Will continue to assess patient to determine if she is interested in pursuing vascular study. Denies pain.   The above assessment and management plan was discussed with the patient. The patient verbalized understanding of and has agreed to the management plan using shared-decision making. Patient is aware to call the clinic if they develop any new symptoms or if symptoms fail to improve or worsen. Patient is aware when to return to the clinic for a follow-up visit. Patient educated on when it is appropriate to go to the emergency department.   Neale Burly, DNP-FNP [facility] [address] [place], [place] [id number] [phone]

## 2022-04-25 ENCOUNTER — Telehealth: Payer: Self-pay | Admitting: Family Medicine

## 2022-04-25 LAB — CBC WITH DIFFERENTIAL/PLATELET
Basophils Absolute: 0.1 10*3/uL (ref 0.0–0.2)
Basos: 1 %
EOS (ABSOLUTE): 0.2 10*3/uL (ref 0.0–0.4)
Eos: 4 %
Hematocrit: 38.3 % (ref 34.0–46.6)
Hemoglobin: 12.5 g/dL (ref 11.1–15.9)
Immature Grans (Abs): 0 10*3/uL (ref 0.0–0.1)
Immature Granulocytes: 1 %
Lymphocytes Absolute: 2 10*3/uL (ref 0.7–3.1)
Lymphs: 32 %
MCH: 28.8 pg (ref 26.6–33.0)
MCHC: 32.6 g/dL (ref 31.5–35.7)
MCV: 88 fL (ref 79–97)
Monocytes Absolute: 0.4 10*3/uL (ref 0.1–0.9)
Monocytes: 7 %
Neutrophils Absolute: 3.4 10*3/uL (ref 1.4–7.0)
Neutrophils: 55 %
Platelets: 236 10*3/uL (ref 150–450)
RBC: 4.34 x10E6/uL (ref 3.77–5.28)
RDW: 12.6 % (ref 11.7–15.4)
WBC: 6.2 10*3/uL (ref 3.4–10.8)

## 2022-04-25 LAB — LIPID PANEL
Chol/HDL Ratio: 3.4 ratio (ref 0.0–4.4)
Cholesterol, Total: 223 mg/dL — ABNORMAL HIGH (ref 100–199)
HDL: 66 mg/dL (ref >39)
LDL Chol Calc (NIH): 126 mg/dL — ABNORMAL HIGH (ref 0–99)
Triglycerides: 179 mg/dL — ABNORMAL HIGH (ref 0–149)
VLDL Cholesterol Cal: 31 mg/dL (ref 5–40)

## 2022-04-25 LAB — CMP14+EGFR
ALT: 13 IU/L (ref 0–32)
AST: 18 IU/L (ref 0–40)
Albumin/Globulin Ratio: 1.6 (ref 1.2–2.2)
Albumin: 4.6 g/dL (ref 3.9–4.9)
Alkaline Phosphatase: 106 IU/L (ref 44–121)
BUN/Creatinine Ratio: 16 (ref 12–28)
BUN: 12 mg/dL (ref 8–27)
Bilirubin Total: 0.3 mg/dL (ref 0.0–1.2)
CO2: 24 mmol/L (ref 20–29)
Calcium: 9.6 mg/dL (ref 8.7–10.3)
Chloride: 96 mmol/L (ref 96–106)
Creatinine, Ser: 0.77 mg/dL (ref 0.57–1.00)
Globulin, Total: 2.8 g/dL (ref 1.5–4.5)
Glucose: 105 mg/dL — ABNORMAL HIGH (ref 70–99)
Potassium: 3.9 mmol/L (ref 3.5–5.2)
Sodium: 140 mmol/L (ref 134–144)
Total Protein: 7.4 g/dL (ref 6.0–8.5)
eGFR: 86 mL/min/{1.73_m2} (ref >59)

## 2022-04-25 NOTE — Telephone Encounter (Signed)
Patient states she will never go back to [facility] in [place]. Please send referral somewhere else. Please call back

## 2022-04-26 ENCOUNTER — Ambulatory Visit: Payer: 59

## 2022-04-26 DIAGNOSIS — M6281 Muscle weakness (generalized): Secondary | ICD-10-CM

## 2022-04-26 DIAGNOSIS — M25551 Pain in right hip: Secondary | ICD-10-CM | POA: Diagnosis not present

## 2022-04-26 DIAGNOSIS — M79604 Pain in right leg: Secondary | ICD-10-CM

## 2022-04-26 NOTE — Therapy (Signed)
OUTPATIENT PHYSICAL THERAPY LOWER EXTREMITY TREATMENT   Patient Name: Dana Koch MRN: [id number] DOB:February 24, 1955, 67 y.o., female Today's Date: 04/26/2022  END OF SESSION:  PT End of Session - 04/26/22 0923     Visit Number 5    Number of Visits 12    Date for PT Re-Evaluation 06/15/22    PT Start Time 0900    PT Stop Time 0958    PT Time Calculation (min) 58 min    Activity Tolerance Patient tolerated treatment well    Behavior During Therapy [facility] for tasks assessed/performed               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 02/10/2018   [facility] +HPV, will schedule colpo with Dr Emelda Fear    Hyperlipidemia    Hypertension    Hypothyroidism    Lymphedema    Neuropathy    Prediabetes    Venous stasis    Past Surgical History:  Procedure Laterality Date   CHOLECYSTECTOMY  01/16/1987   LEG SURGERY Left    broken left leg   TOTAL ABDOMINAL HYSTERECTOMY  2020   Patient Active Problem List   Diagnosis Date Noted   Lung cancer screening declined by patient 12/26/2021   Colon cancer screening declined 12/26/2021   Mammogram declined 12/26/2021   Neuropathic ulcer of foot 11/17/2020   Metabolic syndrome 05/03/2020   Arthritis 05/03/2020   Prediabetes 05/03/2020   Dyslipidemia, goal LDL below 100 03/20/2018   Morbid obesity 06/26/2017   Primary hypertension 02/20/2016   Hypothyroidism 02/20/2016   REFERRING PROVIDER: Case, [place], MD  REFERRING DIAG: Right knee pain, unspecified chronicity  THERAPY DIAG:  Pain in right hip  Pain in right leg  Muscle weakness (generalized)  Rationale for Evaluation and Treatment: Rehabilitation  ONSET DATE: 02/26/22  SUBJECTIVE:   SUBJECTIVE STATEMENT: Patient reports that she is sore today, but no more than normal. She notes that she felt "fantastic" after her last appointment. She notes that she felt good for that afternoon and  into the next morning.   PERTINENT HISTORY: HTN, OA, morbid obesity, and neuropathy PAIN:  Are you having pain? Yes: NPRS scale: 6.5-7/10 Pain location: right groin and quadriceps Pain description: constant ache with intermittent throbbing Aggravating factors: transfers, walking Relieving factors: none known   PRECAUTIONS: None  WEIGHT BEARING RESTRICTIONS: No  FALLS:  Has patient fallen in last 6 months? No  LIVING ENVIRONMENT: Lives with: lives alone Lives in: House/apartment Stairs: Yes: External: 1 steps; none Has following equipment at home: Single point cane and Walker - 2 wheeled  OCCUPATION: retired  PLOF: Independent  PATIENT GOALS: reduced pain, and be able to walk longer   NEXT MD VISIT: none scheduled  OBJECTIVE:   PATIENT SURVEYS:  FOTO 43.04 on 04/26/22  COGNITION: Overall cognitive status: Within functional limits for tasks assessed     SENSATION: Patient has neuropathy in bilateral lower extremities  POSTURE: rounded shoulders and forward head  PALPATION: TTP: right knee medial joint line, patellar tendon, lateral hamstring insertion, and greater trochanter   JOINT MOBILITY:  Lumbar spine: hypomobile with L1-2 causing left shoulder burning  LOWER EXTREMITY ROM: unable to be assessed due to pain and positional intolerance to supine  LOWER EXTREMITY MMT:  MMT Right eval Right 04/26/22 Left eval  Hip flexion 3/5; limited by familiar pain 3+/5 4-/5  Hip extension     Hip abduction  Hip adduction     Hip internal rotation     Hip external rotation     Knee flexion 4-/5 4/5 4/5  Knee extension 4-/5 4-/5; pain in right groin 4/5  Ankle dorsiflexion     Ankle plantarflexion     Ankle inversion     Ankle eversion      (Blank rows = not tested)  LOWER EXTREMITY SPECIAL TESTS:  Unable to be assessed due to increased pain in supine Passive hip IR and ER reproduced familiar pain   FUNCTIONAL TESTS:  Significant difficulty with sit to  stand transfers  GAIT: Assistive device utilized: Single point cane Level of assistance: Modified independence Comments: decreased gait speed, stride length, minimal knee flexion bilaterally, with absent heel strike and toe off   TODAY'S TREATMENT:                                                                                                                              DATE:                                     4/11 EXERCISE LOG  Exercise Repetitions and Resistance Comments  LAQ 2# x 25 reps    Nustep  L1 x 20 minutes   Seated hip IR  10 reps  Limited by pulling   Seated hip ADD isometric  3 minutes w/ 5 second hold   Seated heel raise  3 minutes        Blank cell = exercise not performed today                                    4/8 EXERCISE LOG  Exercise Repetitions and Resistance Comments  Nustep  L1 x 20 minutes   LAQ 20 reps each    Seated hip ADD isometric  3 minutes w/ 5 seconds   Seated hip clam 2.5 minutes         Blank cell = exercise not performed today                                    4/1 EXERCISE LOG  Exercise Repetitions and Resistance Comments  Nustep  L1 x 15 minutes   Seated clams  3 minutes   LAQ 20 reps each    Seated HS stretch 3 x 30 seconds   Standing heel raise  Unable to complete due to previous injury   Standing toe raise  15 reps    Standing hip ABD  10 reps    Blank cell = exercise not performed today    PATIENT EDUCATION:  Education details: MRI, plan of care, progress with therapy, prognosis, and lower extremity anatomy Person educated: Patient Education  method: Explanation Education comprehension: verbalized understanding  HOME EXERCISE PROGRAM: [id number]  ASSESSMENT:  CLINICAL IMPRESSION: Patient is making fair progress with skilled physical therapy as evidenced by her subjective reports, objective measures, and progress toward her goals. She was able to demonstrate a mild improvement in right lower extremity strength. She also  reported experiencing reduced pain after therapy, but this does not last for over 24 hours. Her pain continues to limit her ability to complete prolonged standing and walking activities. She was progressed with familiar interventions for improved strength and reduced pain. She required minimal cueing with seated hip internal rotation for proper performance. However, she experienced a moderate increase in pulling in her right groin which limited this intervention. She was educated on her progress with therapy, prognosis, and plan of care. She reported feeling good upon the conclusion of treatment.  Request 6 additional visits at twice a week for 3 weeks to address her remaining impairments to maximize her functional mobility.  OBJECTIVE IMPAIRMENTS: Abnormal gait, decreased activity tolerance, decreased knowledge of condition, decreased mobility, difficulty walking, decreased ROM, decreased strength, hypomobility, postural dysfunction, and pain.   ACTIVITY LIMITATIONS: carrying, lifting, bending, standing, squatting, sleeping, stairs, transfers, and locomotion level  PARTICIPATION LIMITATIONS: meal prep, cleaning, laundry, shopping, and community activity  PERSONAL FACTORS: 3+ comorbidities: HTN, OA, morbid obesity, and neuropathy  are also affecting patient's functional outcome.   REHAB POTENTIAL: Fair    CLINICAL DECISION MAKING: Evolving/moderate complexity  EVALUATION COMPLEXITY: Moderate   GOALS: Goals reviewed with patient? Yes  LONG TERM GOALS: Target date: 05/07/22  Patient will be independent with her HEP.  Baseline: Patient reports that she is doing her HEP about 3 times per day  Goal status: IN PROGRESS  2.  Patient will be able to complete her daily activities without her familiar pain exceeding 6/10. Baseline:  Goal status: IN PROGRESS  3.  Patient will be able to walk for at least 10 minutes without being limited by her familiar symptoms.  Baseline: most she walks at one  time is to her mailbox and back Goal status: IN PROGRESS  4.  Patient will be able to demonstrate at least 4-/5 right hip flexor strength without being limited by her familiar symptoms for improved function with her daily activities. Baseline: 3+/5 Goal status: IN PROGRESS  PLAN:  PT FREQUENCY: 1-2x/week  PT DURATION: 4 weeks  PLANNED INTERVENTIONS: Therapeutic exercises, Therapeutic activity, Neuromuscular re-education, Balance training, Gait training, Patient/Family education, Self Care, Joint mobilization, Stair training, Electrical stimulation, Cryotherapy, Moist heat, Vasopneumatic device, Manual therapy, and Re-evaluation  PLAN FOR NEXT SESSION: nustep, light hip AROM and strengthening, and modalities as needed   [place] LewisJarrett C Vrinda Heckstall, PT 04/26/2022, 6:11 PM

## 2022-04-30 ENCOUNTER — Ambulatory Visit: Payer: 59 | Admitting: *Deleted

## 2022-04-30 ENCOUNTER — Encounter: Payer: Self-pay | Admitting: *Deleted

## 2022-04-30 ENCOUNTER — Other Ambulatory Visit: Payer: 59

## 2022-04-30 DIAGNOSIS — M25551 Pain in right hip: Secondary | ICD-10-CM | POA: Diagnosis not present

## 2022-04-30 DIAGNOSIS — M6281 Muscle weakness (generalized): Secondary | ICD-10-CM | POA: Diagnosis not present

## 2022-04-30 DIAGNOSIS — M79604 Pain in right leg: Secondary | ICD-10-CM

## 2022-04-30 DIAGNOSIS — D472 Monoclonal gammopathy: Secondary | ICD-10-CM | POA: Diagnosis not present

## 2022-04-30 NOTE — Therapy (Signed)
OUTPATIENT PHYSICAL THERAPY LOWER EXTREMITY TREATMENT   Patient Name: Dana Koch MRN: [id number] DOB:07-15-1955, 67 y.o., female Today's Date: 04/30/2022  END OF SESSION:  PT End of Session - 04/30/22 0912     Visit Number 6    Number of Visits 12    Date for PT Re-Evaluation 06/15/22    PT Start Time 0900    PT Stop Time 0958    PT Time Calculation (min) 58 min               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 02/10/2018   [facility] +HPV, will schedule colpo with Dr Emelda Fear    Hyperlipidemia    Hypertension    Hypothyroidism    Lymphedema    Neuropathy    Prediabetes    Venous stasis    Past Surgical History:  Procedure Laterality Date   CHOLECYSTECTOMY  01/16/1987   LEG SURGERY Left    broken left leg   TOTAL ABDOMINAL HYSTERECTOMY  2020   Patient Active Problem List   Diagnosis Date Noted   Lung cancer screening declined by patient 12/26/2021   Colon cancer screening declined 12/26/2021   Mammogram declined 12/26/2021   Neuropathic ulcer of foot 11/17/2020   Metabolic syndrome 05/03/2020   Arthritis 05/03/2020   Prediabetes 05/03/2020   Dyslipidemia, goal LDL below 100 03/20/2018   Morbid obesity 06/26/2017   Primary hypertension 02/20/2016   Hypothyroidism 02/20/2016   REFERRING PROVIDER: Case, [place], MD  REFERRING DIAG: Right knee pain, unspecified chronicity  THERAPY DIAG:  Pain in right hip  Pain in right leg  Muscle weakness (generalized)  Rationale for Evaluation and Treatment: Rehabilitation  ONSET DATE: 02/26/22  SUBJECTIVE:   SUBJECTIVE STATEMENT: Patient reports that she is very sore today and hurt all weekend. Not getting any better.  Will be referred by PCP to see MD at [facility]  PERTINENT HISTORY: HTN, OA, morbid obesity, and neuropathy PAIN:  Are you having pain? Yes: NPRS scale: 6-7/10 Pain location: right groin and  quadriceps Pain description: constant ache with intermittent throbbing Aggravating factors: transfers, walking Relieving factors: none known   PRECAUTIONS: None  WEIGHT BEARING RESTRICTIONS: No  FALLS:  Has patient fallen in last 6 months? No  LIVING ENVIRONMENT: Lives with: lives alone Lives in: House/apartment Stairs: Yes: External: 1 steps; none Has following equipment at home: Single point cane and Walker - 2 wheeled  OCCUPATION: retired  PLOF: Independent  PATIENT GOALS: reduced pain, and be able to walk longer   NEXT MD VISIT: none scheduled  OBJECTIVE:   PATIENT SURVEYS:  FOTO 43.04 on 04/26/22  COGNITION: Overall cognitive status: Within functional limits for tasks assessed     SENSATION: Patient has neuropathy in bilateral lower extremities  POSTURE: rounded shoulders and forward head  PALPATION: TTP: right knee medial joint line, patellar tendon, lateral hamstring insertion, and greater trochanter   JOINT MOBILITY:  Lumbar spine: hypomobile with L1-2 causing left shoulder burning  LOWER EXTREMITY ROM: unable to be assessed due to pain and positional intolerance to supine  LOWER EXTREMITY MMT:  MMT Right eval Right 04/26/22 Left eval  Hip flexion 3/5; limited by familiar pain 3+/5 4-/5  Hip extension     Hip abduction     Hip adduction     Hip internal rotation     Hip external rotation     Knee flexion 4-/5 4/5 4/5  Knee extension 4-/5  4-/5; pain in right groin 4/5  Ankle dorsiflexion     Ankle plantarflexion     Ankle inversion     Ankle eversion      (Blank rows = not tested)  LOWER EXTREMITY SPECIAL TESTS:  Unable to be assessed due to increased pain in supine Passive hip IR and ER reproduced familiar pain   FUNCTIONAL TESTS:  Significant difficulty with sit to stand transfers  GAIT: Assistive device utilized: Single point cane Level of assistance: Modified independence Comments: decreased gait speed, stride length, minimal knee  flexion bilaterally, with absent heel strike and toe off   TODAY'S TREATMENT:                                                                                                                              DATE:                                     4/15 EXERCISE LOG  Exercise Repetitions and Resistance Comments  LAQ 2# x 3x10 reps hold 3-5 secs   Nustep  L1 x 20 minutes   Seated hip IR   Limited by pulling   Seated hip ADD isometric     Seated heel raise          Blank cell = exercise not performed today  Manual: patella mobs   RT knee  IFC x 15 mins 80-150hz   to RT quad with Vaso to RT knee                                     4/8 EXERCISE LOG  Exercise Repetitions and Resistance Comments  Nustep  L1 x 20 minutes   LAQ 20 reps each    Seated hip ADD isometric  3 minutes w/ 5 seconds   Seated hip clam 2.5 minutes         Blank cell = exercise not performed today                                    4/1 EXERCISE LOG  Exercise Repetitions and Resistance Comments  Nustep  L1 x 15 minutes   Seated clams  3 minutes   LAQ 20 reps each    Seated HS stretch 3 x 30 seconds   Standing heel raise  Unable to complete due to previous injury   Standing toe raise  15 reps    Standing hip ABD  10 reps    Blank cell = exercise not performed today    PATIENT EDUCATION:  Education details: MRI, plan of care, progress with therapy, prognosis, and lower extremity anatomy Person educated: Patient Education method: Explanation Education comprehension: verbalized understanding  HOME EXERCISE PROGRAM: [id number]  ASSESSMENT:  CLINICAL IMPRESSION: Patient arrived today doing fair with RT leg, but c/o increased pain over the weekend. She reports that she will also be referred to [facility] for a consult. Conservative Rx performed today due to increased pain. She was able to perform some exs and finished session with Vaso and IFC to RT leg. Pt reports decreased pain end of  session.    OBJECTIVE IMPAIRMENTS: Abnormal gait, decreased activity tolerance, decreased knowledge of condition, decreased mobility, difficulty walking, decreased ROM, decreased strength, hypomobility, postural dysfunction, and pain.   ACTIVITY LIMITATIONS: carrying, lifting, bending, standing, squatting, sleeping, stairs, transfers, and locomotion level  PARTICIPATION LIMITATIONS: meal prep, cleaning, laundry, shopping, and community activity  PERSONAL FACTORS: 3+ comorbidities: HTN, OA, morbid obesity, and neuropathy  are also affecting patient's functional outcome.   REHAB POTENTIAL: Fair    CLINICAL DECISION MAKING: Evolving/moderate complexity  EVALUATION COMPLEXITY: Moderate   GOALS: Goals reviewed with patient? Yes  LONG TERM GOALS: Target date: 05/07/22  Patient will be independent with her HEP.  Baseline: Patient reports that she is doing her HEP about 3 times per day  Goal status: IN PROGRESS  2.  Patient will be able to complete her daily activities without her familiar pain exceeding 6/10. Baseline:  Goal status: IN PROGRESS  3.  Patient will be able to walk for at least 10 minutes without being limited by her familiar symptoms.  Baseline: most she walks at one time is to her mailbox and back Goal status: IN PROGRESS  4.  Patient will be able to demonstrate at least 4-/5 right hip flexor strength without being limited by her familiar symptoms for improved function with her daily activities. Baseline: 3+/5 Goal status: IN PROGRESS  PLAN:  PT FREQUENCY: 1-2x/week  PT DURATION: 4 weeks  PLANNED INTERVENTIONS: Therapeutic exercises, Therapeutic activity, Neuromuscular re-education, Balance training, Gait training, Patient/Family education, Self Care, Joint mobilization, Stair training, Electrical stimulation, Cryotherapy, Moist heat, Vasopneumatic device, Manual therapy, and Re-evaluation  PLAN FOR NEXT SESSION: nustep, light hip AROM and strengthening, and  modalities as needed   Xaiden Fleig,CHRIS, PTA 04/30/2022, 1:25 PM

## 2022-04-30 NOTE — Telephone Encounter (Signed)
Pt aware referral resent to [facility].

## 2022-05-03 ENCOUNTER — Ambulatory Visit: Payer: 59

## 2022-05-03 DIAGNOSIS — M79604 Pain in right leg: Secondary | ICD-10-CM

## 2022-05-03 DIAGNOSIS — M25551 Pain in right hip: Secondary | ICD-10-CM

## 2022-05-03 DIAGNOSIS — M6281 Muscle weakness (generalized): Secondary | ICD-10-CM

## 2022-05-03 NOTE — Therapy (Signed)
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 02/10/2018   [facility] +HPV, will schedule colpo with Dr Emelda Fear    Hyperlipidemia    Hypertension    Hypothyroidism    Lymphedema    Neuropathy    Prediabetes    Venous stasis    Past Surgical History:  Procedure Laterality Date   CHOLECYSTECTOMY  01/16/1987   LEG SURGERY Left    broken left leg   TOTAL ABDOMINAL HYSTERECTOMY  2020   Patient Active Problem List   Diagnosis Date Noted   Lung cancer screening declined by patient 12/26/2021   Colon cancer screening declined 12/26/2021   Mammogram declined 12/26/2021   Neuropathic ulcer of foot 11/17/2020   Metabolic syndrome 05/03/2020   Arthritis 05/03/2020   Prediabetes 05/03/2020   Dyslipidemia, goal LDL below 100 03/20/2018   Morbid obesity 06/26/2017   Primary hypertension 02/20/2016   Hypothyroidism 02/20/2016   REFERRING PROVIDER: Case, [place], MD  REFERRING DIAG: Right knee pain, unspecified chronicity  THERAPY DIAG:  Pain in right hip  Pain in right leg  Muscle weakness (generalized)  Rationale for Evaluation and Treatment: Rehabilitation  ONSET DATE: 02/26/22  SUBJECTIVE:   SUBJECTIVE STATEMENT: Patient reports that she felt wonderful for a day or two after her last appointment, but it is hurting again today.   PERTINENT HISTORY: HTN, OA, morbid obesity, and  neuropathy PAIN:  Are you having pain? Yes: NPRS scale: 5/10 Pain location: right groin and quadriceps Pain description: constant ache with intermittent throbbing Aggravating factors: transfers, walking Relieving factors: none known   PRECAUTIONS: None  WEIGHT BEARING RESTRICTIONS: No  FALLS:  Has patient fallen in last 6 months? No  LIVING ENVIRONMENT: Lives with: lives alone Lives in: House/apartment Stairs: Yes: External: 1 steps; none Has following equipment at home: Single point cane and Walker - 2 wheeled  OCCUPATION: retired  PLOF: Independent  PATIENT GOALS: reduced pain, and be able to walk longer   NEXT MD VISIT: none scheduled  OBJECTIVE:   PATIENT SURVEYS:  FOTO 43.04 on 04/26/22  COGNITION: Overall cognitive status: Within functional limits for tasks assessed     SENSATION: Patient has neuropathy in bilateral lower extremities  POSTURE: rounded shoulders and forward head  PALPATION: TTP: right knee medial joint line, patellar tendon, lateral hamstring insertion, and greater trochanter   JOINT MOBILITY:  Lumbar spine: hypomobile with L1-2 causing left shoulder burning  LOWER EXTREMITY ROM: unable to be assessed due to pain and positional intolerance to supine  LOWER EXTREMITY MMT:  MMT Right eval Right 04/26/22 Left eval  Hip flexion 3/5; limited by familiar pain 3+/5 4-/5  Hip extension     Hip abduction     Hip adduction     Hip internal rotation     Hip external  rotation     Knee flexion 4-/5 4/5 4/5  Knee extension 4-/5 4-/5; pain in right groin 4/5  Ankle dorsiflexion     Ankle plantarflexion     Ankle inversion     Ankle eversion      (Blank rows = not tested)  LOWER EXTREMITY SPECIAL TESTS:  Unable to be assessed due to increased pain in supine Passive hip IR and ER reproduced familiar pain   FUNCTIONAL TESTS:  Significant difficulty with sit to stand transfers  GAIT: Assistive device utilized: Single point cane Level of  assistance: Modified independence Comments: decreased gait speed, stride length, minimal knee flexion bilaterally, with absent heel strike and toe off   TODAY'S TREATMENT:                                                                                                                              DATE:                                     4/18 EXERCISE LOG  Exercise Repetitions and Resistance Comments  Nustep  L2 x 15 minutes    LAQ 2# x 30 reps each    Seated marching  2# x 2 minutes    R seated heel raise  2 minutes    Seated clams  3 minutes    Blank cell = exercise not performed today  Modalities  Date:  Unattended Estim: Knee, pre mod @ 80-150 Hz, 15 mins, Pain Vaso: Knee, 34 degrees; low pressure, 15 mins, Pain                                   4/15 EXERCISE LOG  Exercise Repetitions and Resistance Comments  LAQ 2# x 3x10 reps hold 3-5 secs   Nustep  L1 x 20 minutes   Seated hip IR   Limited by pulling   Seated hip ADD isometric     Seated heel raise          Blank cell = exercise not performed today  Manual: patella mobs   RT knee  IFC x 15 mins 80-150hz   to RT quad with Vaso to RT knee                                   4/8 EXERCISE LOG  Exercise Repetitions and Resistance Comments  Nustep  L1 x 20 minutes   LAQ 20 reps each    Seated hip ADD isometric  3 minutes w/ 5 seconds   Seated hip clam 2.5 minutes         Blank cell = exercise not performed today   PATIENT EDUCATION:  Education details: MRI, plan of care, progress with therapy, prognosis,  and lower extremity anatomy Person educated: Patient Education method: Explanation Education comprehension: verbalized understanding  HOME EXERCISE PROGRAM: [id number]  ASSESSMENT:  CLINICAL IMPRESSION: Treatment focused on familiar interventions due to her reduced pain following her previous appointment.  She required minimal cueing with today's interventions for proper pacing to avoid aggravating her familiar  symptoms. She reported that she "can still feel it" regarding her pain at the conclusion of treatment. She continues to require skilled physical therapy to address her remaining impairments to maximize her safety and functional mobility.   OBJECTIVE IMPAIRMENTS: Abnormal gait, decreased activity tolerance, decreased knowledge of condition, decreased mobility, difficulty walking, decreased ROM, decreased strength, hypomobility, postural dysfunction, and pain.   ACTIVITY LIMITATIONS: carrying, lifting, bending, standing, squatting, sleeping, stairs, transfers, and locomotion level  PARTICIPATION LIMITATIONS: meal prep, cleaning, laundry, shopping, and community activity  PERSONAL FACTORS: 3+ comorbidities: HTN, OA, morbid obesity, and neuropathy  are also affecting patient's functional outcome.   REHAB POTENTIAL: Fair    CLINICAL DECISION MAKING: Evolving/moderate complexity  EVALUATION COMPLEXITY: Moderate   GOALS: Goals reviewed with patient? Yes  LONG TERM GOALS: Target date: 05/07/22  Patient will be independent with her HEP.  Baseline: Patient reports that she is doing her HEP about 3 times per day  Goal status: IN PROGRESS  2.  Patient will be able to complete her daily activities without her familiar pain exceeding 6/10. Baseline:  Goal status: IN PROGRESS  3.  Patient will be able to walk for at least 10 minutes without being limited by her familiar symptoms.  Baseline: most she walks at one time is to her mailbox and back Goal status: IN PROGRESS  4.  Patient will be able to demonstrate at least 4-/5 right hip flexor strength without being limited by her familiar symptoms for improved function with her daily activities. Baseline: 3+/5 Goal status: IN PROGRESS  PLAN:  PT FREQUENCY: 1-2x/week  PT DURATION: 4 weeks  PLANNED INTERVENTIONS: Therapeutic exercises, Therapeutic activity, Neuromuscular re-education, Balance training, Gait training, Patient/Family education,  Self Care, Joint mobilization, Stair training, Electrical stimulation, Cryotherapy, Moist heat, Vasopneumatic device, Manual therapy, and Re-evaluation  PLAN FOR NEXT SESSION: nustep, light hip AROM and strengthening, and modalities as needed   Granville Lewis, PT 05/03/2022, 6:55 PM

## 2022-05-07 ENCOUNTER — Ambulatory Visit: Payer: 59

## 2022-05-07 DIAGNOSIS — M25551 Pain in right hip: Secondary | ICD-10-CM | POA: Diagnosis not present

## 2022-05-07 DIAGNOSIS — M6281 Muscle weakness (generalized): Secondary | ICD-10-CM

## 2022-05-07 DIAGNOSIS — M79604 Pain in right leg: Secondary | ICD-10-CM | POA: Diagnosis not present

## 2022-05-07 DIAGNOSIS — D472 Monoclonal gammopathy: Secondary | ICD-10-CM | POA: Diagnosis not present

## 2022-05-07 NOTE — Therapy (Signed)
OUTPATIENT PHYSICAL THERAPY LOWER EXTREMITY TREATMENT   Patient Name: Dana Koch MRN: [id number] DOB:09-Jun-1955, 67 y.o., female Today's Date: 05/07/2022  END OF SESSION:  PT End of Session - 05/07/22 0912     Visit Number 8    Number of Visits 12    Date for PT Re-Evaluation 06/15/22    PT Start Time 0900    PT Stop Time 1001    PT Time Calculation (min) 61 min    Activity Tolerance Patient tolerated treatment well    Behavior During Therapy [facility] for tasks assessed/performed               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 02/10/2018   [facility] +HPV, will schedule colpo with Dr Emelda Fear    Hyperlipidemia    Hypertension    Hypothyroidism    Lymphedema    Neuropathy    Prediabetes    Venous stasis    Past Surgical History:  Procedure Laterality Date   CHOLECYSTECTOMY  01/16/1987   LEG SURGERY Left    broken left leg   TOTAL ABDOMINAL HYSTERECTOMY  2020   Patient Active Problem List   Diagnosis Date Noted   Lung cancer screening declined by patient 12/26/2021   Colon cancer screening declined 12/26/2021   Mammogram declined 12/26/2021   Neuropathic ulcer of foot 11/17/2020   Metabolic syndrome 05/03/2020   Arthritis 05/03/2020   Prediabetes 05/03/2020   Dyslipidemia, goal LDL below 100 03/20/2018   Morbid obesity 06/26/2017   Primary hypertension 02/20/2016   Hypothyroidism 02/20/2016   REFERRING PROVIDER: Case, [place], MD  REFERRING DIAG: Right knee pain, unspecified chronicity  THERAPY DIAG:  Pain in right hip  Pain in right leg  Muscle weakness (generalized)  Rationale for Evaluation and Treatment: Rehabilitation  ONSET DATE: 02/26/22  SUBJECTIVE:   SUBJECTIVE STATEMENT: Patient reports that her leg feels "like crap today." She feels that she is not getting anywhere with it. She has not heard from [facility] since her last appointment.   PERTINENT  HISTORY: HTN, OA, morbid obesity, and neuropathy PAIN:  Are you having pain? Yes: NPRS scale: 8/10 Pain location: right groin and quadriceps Pain description: constant ache with intermittent throbbing Aggravating factors: transfers, walking Relieving factors: none known   PRECAUTIONS: None  WEIGHT BEARING RESTRICTIONS: No  FALLS:  Has patient fallen in last 6 months? No  LIVING ENVIRONMENT: Lives with: lives alone Lives in: House/apartment Stairs: Yes: External: 1 steps; none Has following equipment at home: Single point cane and Walker - 2 wheeled  OCCUPATION: retired  PLOF: Independent  PATIENT GOALS: reduced pain, and be able to walk longer   NEXT MD VISIT: none scheduled  OBJECTIVE:   PATIENT SURVEYS:  FOTO 43.04 on 04/26/22  COGNITION: Overall cognitive status: Within functional limits for tasks assessed     SENSATION: Patient has neuropathy in bilateral lower extremities  POSTURE: rounded shoulders and forward head  PALPATION: TTP: right knee medial joint line, patellar tendon, lateral hamstring insertion, and greater trochanter   JOINT MOBILITY:  Lumbar spine: hypomobile with L1-2 causing left shoulder burning  LOWER EXTREMITY ROM: unable to be assessed due to pain and positional intolerance to supine  LOWER EXTREMITY MMT:  MMT Right eval Right 04/26/22 Left eval  Hip flexion 3/5; limited by familiar pain 3+/5 4-/5  Hip extension     Hip abduction     Hip adduction     Hip  internal rotation     Hip external rotation     Knee flexion 4-/5 4/5 4/5  Knee extension 4-/5 4-/5; pain in right groin 4/5  Ankle dorsiflexion     Ankle plantarflexion     Ankle inversion     Ankle eversion      (Blank rows = not tested)  LOWER EXTREMITY SPECIAL TESTS:  Unable to be assessed due to increased pain in supine Passive hip IR and ER reproduced familiar pain   FUNCTIONAL TESTS:  Significant difficulty with sit to stand transfers  GAIT: Assistive device  utilized: Single point cane Level of assistance: Modified independence Comments: decreased gait speed, stride length, minimal knee flexion bilaterally, with absent heel strike and toe off   TODAY'S TREATMENT:                                                                                                                              DATE:                                     4/22 EXERCISE LOG  Exercise Repetitions and Resistance Comments  Nustep  L1 x 20 minutes    Seated hip ADD isometric  3 minutes w/ 5 second hold    LAQ 15 reps     Blank cell = exercise not performed today  Modalities: no redness or adverse reaction to today's modalities  Date:  Unattended Estim: right quadriceps , IFC @ 80-150 Hz w/ 40% scan, 15 mins, Pain Vaso: right thigh, 34 degrees; low pressure, 15 mins, Pain                                   4/18 EXERCISE LOG  Exercise Repetitions and Resistance Comments  Nustep  L2 x 15 minutes    LAQ 2# x 30 reps each    Seated marching  2# x 2 minutes    R seated heel raise  2 minutes    Seated clams  3 minutes    Blank cell = exercise not performed today  Modalities  Date:  Unattended Estim: Knee, pre mod @ 80-150 Hz, 15 mins, Pain Vaso: Knee, 34 degrees; low pressure, 15 mins, Pain                                   4/15 EXERCISE LOG  Exercise Repetitions and Resistance Comments  LAQ 2# x 3x10 reps hold 3-5 secs   Nustep  L1 x 20 minutes   Seated hip IR   Limited by pulling   Seated hip ADD isometric     Seated heel raise          Blank cell = exercise not performed today  Manual:  patella mobs   RT knee  IFC x 15 mins 80-150hz   to RT quad with Vaso to RT knee  PATIENT EDUCATION:  Education details: MRI, plan of care, progress with therapy, prognosis, and lower extremity anatomy Person educated: Patient Education method: Explanation Education comprehension: verbalized understanding  HOME EXERCISE PROGRAM: [id number]  ASSESSMENT:  CLINICAL  IMPRESSION: Treatment focused on familiar interventions due to high pain severity. She had multiple question regarding the proper use of electrical stimulation. She was educated on the proper application and the safe use of this modality. She reported understanding. She reported feeling fine upon the conclusion of treatment. She continues to require skilled physical therapy to address her remaining impairments to maximize her functional mobility.   OBJECTIVE IMPAIRMENTS: Abnormal gait, decreased activity tolerance, decreased knowledge of condition, decreased mobility, difficulty walking, decreased ROM, decreased strength, hypomobility, postural dysfunction, and pain.   ACTIVITY LIMITATIONS: carrying, lifting, bending, standing, squatting, sleeping, stairs, transfers, and locomotion level  PARTICIPATION LIMITATIONS: meal prep, cleaning, laundry, shopping, and community activity  PERSONAL FACTORS: 3+ comorbidities: HTN, OA, morbid obesity, and neuropathy  are also affecting patient's functional outcome.   REHAB POTENTIAL: Fair    CLINICAL DECISION MAKING: Evolving/moderate complexity  EVALUATION COMPLEXITY: Moderate   GOALS: Goals reviewed with patient? Yes  LONG TERM GOALS: Target date: 05/07/22  Patient will be independent with her HEP.  Baseline: Patient reports that she is doing her HEP about 3 times per day  Goal status: IN PROGRESS  2.  Patient will be able to complete her daily activities without her familiar pain exceeding 6/10. Baseline:  Goal status: IN PROGRESS  3.  Patient will be able to walk for at least 10 minutes without being limited by her familiar symptoms.  Baseline: most she walks at one time is to her mailbox and back Goal status: IN PROGRESS  4.  Patient will be able to demonstrate at least 4-/5 right hip flexor strength without being limited by her familiar symptoms for improved function with her daily activities. Baseline: 3+/5 Goal status: IN  PROGRESS  PLAN:  PT FREQUENCY: 1-2x/week  PT DURATION: 4 weeks  PLANNED INTERVENTIONS: Therapeutic exercises, Therapeutic activity, Neuromuscular re-education, Balance training, Gait training, Patient/Family education, Self Care, Joint mobilization, Stair training, Electrical stimulation, Cryotherapy, Moist heat, Vasopneumatic device, Manual therapy, and Re-evaluation  PLAN FOR NEXT SESSION: nustep, light hip AROM and strengthening, and modalities as needed   Granville Lewis, PT 05/07/2022, 12:42 PM

## 2022-05-10 ENCOUNTER — Ambulatory Visit: Payer: 59

## 2022-05-10 DIAGNOSIS — M6281 Muscle weakness (generalized): Secondary | ICD-10-CM

## 2022-05-10 DIAGNOSIS — M25551 Pain in right hip: Secondary | ICD-10-CM

## 2022-05-10 DIAGNOSIS — M79604 Pain in right leg: Secondary | ICD-10-CM

## 2022-05-10 NOTE — Therapy (Signed)
OUTPATIENT PHYSICAL THERAPY LOWER EXTREMITY TREATMENT   Patient Name: Dana Koch MRN: [id number] DOB:1955/07/26, 67 y.o., female Today's Date: 05/10/2022  END OF SESSION:  PT End of Session - 05/10/22 0906     Visit Number 9    Number of Visits 12    Date for PT Re-Evaluation 06/15/22    PT Start Time 0900    PT Stop Time 1002    PT Time Calculation (min) 62 min    Activity Tolerance Patient tolerated treatment well    Behavior During Therapy [facility] for tasks assessed/performed               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 02/10/2018   [facility] +HPV, will schedule colpo with Dr Emelda Fear    Hyperlipidemia    Hypertension    Hypothyroidism    Lymphedema    Neuropathy    Prediabetes    Venous stasis    Past Surgical History:  Procedure Laterality Date   CHOLECYSTECTOMY  01/16/1987   LEG SURGERY Left    broken left leg   TOTAL ABDOMINAL HYSTERECTOMY  2020   Patient Active Problem List   Diagnosis Date Noted   Lung cancer screening declined by patient 12/26/2021   Colon cancer screening declined 12/26/2021   Mammogram declined 12/26/2021   Neuropathic ulcer of foot 11/17/2020   Metabolic syndrome 05/03/2020   Arthritis 05/03/2020   Prediabetes 05/03/2020   Dyslipidemia, goal LDL below 100 03/20/2018   Morbid obesity 06/26/2017   Primary hypertension 02/20/2016   Hypothyroidism 02/20/2016   REFERRING PROVIDER: Case, [place], MD  REFERRING DIAG: Right knee pain, unspecified chronicity  THERAPY DIAG:  Pain in right hip  Pain in right leg  Muscle weakness (generalized)  Rationale for Evaluation and Treatment: Rehabilitation  ONSET DATE: 02/26/22  SUBJECTIVE:   SUBJECTIVE STATEMENT: Patient reports that her pain is now in her right hip instead of her right knee. She has an appointment with [facility] on Tuesday (4/30). She notes that her TENS unit is really  helping her to sleep.   PERTINENT HISTORY: HTN, OA, morbid obesity, and neuropathy PAIN:  Are you having pain? Yes: NPRS scale: no score provided /10 Pain location: right groin and quadriceps Pain description: constant ache with intermittent throbbing Aggravating factors: transfers, walking Relieving factors: none known   PRECAUTIONS: None  WEIGHT BEARING RESTRICTIONS: No  FALLS:  Has patient fallen in last 6 months? No  LIVING ENVIRONMENT: Lives with: lives alone Lives in: House/apartment Stairs: Yes: External: 1 steps; none Has following equipment at home: Single point cane and Walker - 2 wheeled  OCCUPATION: retired  PLOF: Independent  PATIENT GOALS: reduced pain, and be able to walk longer   NEXT MD VISIT: none scheduled  OBJECTIVE:   PATIENT SURVEYS:  FOTO 43.04 on 04/26/22  COGNITION: Overall cognitive status: Within functional limits for tasks assessed     SENSATION: Patient has neuropathy in bilateral lower extremities  POSTURE: rounded shoulders and forward head  PALPATION: TTP: right knee medial joint line, patellar tendon, lateral hamstring insertion, and greater trochanter   JOINT MOBILITY:  Lumbar spine: hypomobile with L1-2 causing left shoulder burning  LOWER EXTREMITY ROM: unable to be assessed due to pain and positional intolerance to supine  LOWER EXTREMITY MMT:  MMT Right eval Right 04/26/22 Left eval  Hip flexion 3/5; limited by familiar pain 3+/5 4-/5  Hip extension     Hip abduction  Hip adduction     Hip internal rotation     Hip external rotation     Knee flexion 4-/5 4/5 4/5  Knee extension 4-/5 4-/5; pain in right groin 4/5  Ankle dorsiflexion     Ankle plantarflexion     Ankle inversion     Ankle eversion      (Blank rows = not tested)  LOWER EXTREMITY SPECIAL TESTS:  Unable to be assessed due to increased pain in supine Passive hip IR and ER reproduced familiar pain   FUNCTIONAL TESTS:  Significant difficulty  with sit to stand transfers  GAIT: Assistive device utilized: Single point cane Level of assistance: Modified independence Comments: decreased gait speed, stride length, minimal knee flexion bilaterally, with absent heel strike and toe off   TODAY'S TREATMENT:                                                                                                                              DATE:                                     4/25 EXERCISE LOG  Exercise Repetitions and Resistance Comments  Nustep  L2 x 20 minutes    Seated hip ADD isometric 3 minutes w/ 5 second hold    Hip ABD isometric  2 minutes w/ 5 second hold  With therapist assistance  Seated hip extension isometric  3 minutes w/ 5 second hold         Blank cell = exercise not performed today  Modalities  Date:  Unattended Estim: right quadriceps, IFC @ 80-150 Hz w/ 40% scan, 15 mins, Pain Vaso: Knee, 34 degrees; low pressure, 15 mins, Pain                                   4/22 EXERCISE LOG  Exercise Repetitions and Resistance Comments  Nustep  L1 x 20 minutes    Seated hip ADD isometric  3 minutes w/ 5 second hold    LAQ 15 reps     Blank cell = exercise not performed today  Modalities: no redness or adverse reaction to today's modalities  Date:  Unattended Estim: right quadriceps , IFC @ 80-150 Hz w/ 40% scan, 15 mins, Pain Vaso: right thigh, 34 degrees; low pressure, 15 mins, Pain                                   4/18 EXERCISE LOG  Exercise Repetitions and Resistance Comments  Nustep  L2 x 15 minutes    LAQ 2# x 30 reps each    Seated marching  2# x 2 minutes    R seated heel raise  2 minutes  Seated clams  3 minutes    Blank cell = exercise not performed today  Modalities  Date:  Unattended Estim: Knee, pre mod @ 80-150 Hz, 15 mins, Pain Vaso: Knee, 34 degrees; low pressure, 15 mins, Pain  PATIENT EDUCATION:  Education details: MD follow up, HEP, and anatomy Person educated: Patient Education  method: Explanation Education comprehension: verbalized understanding  HOME EXERCISE PROGRAM: [id number], [id number] on 05/10/22  ASSESSMENT:  CLINICAL IMPRESSION: Patient presented to treatment with increased right hip pain since her last appointment with no known cause.  Treatment focused on isometric hip interventions for reduced pain with good results.  She experienced no increased pain or discomfort with any of today's interventions.  She reported feeling better upon the conclusion of today's interventions.  Recommend that she continue with skilled physical therapy to address her remaining impairments to maximize her functional mobility.  OBJECTIVE IMPAIRMENTS: Abnormal gait, decreased activity tolerance, decreased knowledge of condition, decreased mobility, difficulty walking, decreased ROM, decreased strength, hypomobility, postural dysfunction, and pain.   ACTIVITY LIMITATIONS: carrying, lifting, bending, standing, squatting, sleeping, stairs, transfers, and locomotion level  PARTICIPATION LIMITATIONS: meal prep, cleaning, laundry, shopping, and community activity  PERSONAL FACTORS: 3+ comorbidities: HTN, OA, morbid obesity, and neuropathy  are also affecting patient's functional outcome.   REHAB POTENTIAL: Fair    CLINICAL DECISION MAKING: Evolving/moderate complexity  EVALUATION COMPLEXITY: Moderate   GOALS: Goals reviewed with patient? Yes  LONG TERM GOALS: Target date: 05/07/22  Patient will be independent with her HEP.  Baseline: Patient reports that she is doing her HEP about 3 times per day  Goal status: IN PROGRESS  2.  Patient will be able to complete her daily activities without her familiar pain exceeding 6/10. Baseline:  Goal status: IN PROGRESS  3.  Patient will be able to walk for at least 10 minutes without being limited by her familiar symptoms.  Baseline: most she walks at one time is to her mailbox and back Goal status: IN PROGRESS  4.  Patient will be  able to demonstrate at least 4-/5 right hip flexor strength without being limited by her familiar symptoms for improved function with her daily activities. Baseline: 3+/5 Goal status: IN PROGRESS  PLAN:  PT FREQUENCY: 1-2x/week  PT DURATION: 4 weeks  PLANNED INTERVENTIONS: Therapeutic exercises, Therapeutic activity, Neuromuscular re-education, Balance training, Gait training, Patient/Family education, Self Care, Joint mobilization, Stair training, Electrical stimulation, Cryotherapy, Moist heat, Vasopneumatic device, Manual therapy, and Re-evaluation  PLAN FOR NEXT SESSION: nustep, light hip AROM and strengthening, and modalities as needed   Granville Lewis, PT 05/10/2022, 10:23 AM

## 2022-05-11 ENCOUNTER — Encounter: Payer: Self-pay | Admitting: Family Medicine

## 2022-05-14 ENCOUNTER — Ambulatory Visit: Payer: 59

## 2022-05-15 DIAGNOSIS — M79604 Pain in right leg: Secondary | ICD-10-CM | POA: Diagnosis not present

## 2022-05-16 ENCOUNTER — Ambulatory Visit: Payer: 59 | Admitting: Family Medicine

## 2022-05-17 ENCOUNTER — Ambulatory Visit: Payer: 59 | Attending: Orthopedic Surgery

## 2022-05-17 DIAGNOSIS — M79604 Pain in right leg: Secondary | ICD-10-CM | POA: Diagnosis not present

## 2022-05-17 DIAGNOSIS — M6281 Muscle weakness (generalized): Secondary | ICD-10-CM | POA: Insufficient documentation

## 2022-05-17 DIAGNOSIS — M25551 Pain in right hip: Secondary | ICD-10-CM | POA: Diagnosis not present

## 2022-05-17 NOTE — Therapy (Signed)
OUTPATIENT PHYSICAL THERAPY LOWER EXTREMITY TREATMENT   Patient Name: Mea Ozga MRN: [id number] DOB:05-Jul-1955, 67 y.o., female Today's Date: 05/17/2022  END OF SESSION:  PT End of Session - 05/17/22 0859     Visit Number 10    Number of Visits 12    Date for PT Re-Evaluation 06/15/22    PT Start Time 0900    PT Stop Time 0959    PT Time Calculation (min) 59 min    Activity Tolerance Patient tolerated treatment well    Behavior During Therapy [facility] for tasks assessed/performed               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 02/10/2018   [facility] +HPV, will schedule colpo with Dr Emelda Fear    Hyperlipidemia    Hypertension    Hypothyroidism    Lymphedema    Neuropathy    Prediabetes    Venous stasis    Past Surgical History:  Procedure Laterality Date   CHOLECYSTECTOMY  01/16/1987   LEG SURGERY Left    broken left leg   TOTAL ABDOMINAL HYSTERECTOMY  2020   Patient Active Problem List   Diagnosis Date Noted   Lung cancer screening declined by patient 12/26/2021   Colon cancer screening declined 12/26/2021   Mammogram declined 12/26/2021   Neuropathic ulcer of foot (HCC) 11/17/2020   Metabolic syndrome 05/03/2020   Arthritis 05/03/2020   Prediabetes 05/03/2020   Dyslipidemia, goal LDL below 100 03/20/2018   Morbid obesity (HCC) 06/26/2017   Primary hypertension 02/20/2016   Hypothyroidism 02/20/2016   REFERRING PROVIDER: Case, [place], MD  REFERRING DIAG: Right knee pain, unspecified chronicity  THERAPY DIAG:  Pain in right hip  Pain in right leg  Muscle weakness (generalized)  Rationale for Evaluation and Treatment: Rehabilitation  ONSET DATE: 02/26/22  SUBJECTIVE:   SUBJECTIVE STATEMENT: Patient reports that she was not hurting at all yesterday. However, she woke hurting some this morning. She had an appointment at [facility] last week. They want her to  continue with physical therapy and they changed her medicine which seems to be really helping.   PERTINENT HISTORY: HTN, OA, morbid obesity, and neuropathy PAIN:  Are you having pain? Yes: NPRS scale: 2 /10 Pain location: right groin and quadriceps Pain description: constant ache with intermittent throbbing Aggravating factors: transfers, walking Relieving factors: none known   PRECAUTIONS: None  WEIGHT BEARING RESTRICTIONS: No  FALLS:  Has patient fallen in last 6 months? No  LIVING ENVIRONMENT: Lives with: lives alone Lives in: House/apartment Stairs: Yes: External: 1 steps; none Has following equipment at home: Single point cane and Walker - 2 wheeled  OCCUPATION: retired  PLOF: Independent  PATIENT GOALS: reduced pain, and be able to walk longer   NEXT MD VISIT: none scheduled  OBJECTIVE:   PATIENT SURVEYS:  FOTO 56.54 on 05/17/22  COGNITION: Overall cognitive status: Within functional limits for tasks assessed     SENSATION: Patient has neuropathy in bilateral lower extremities  POSTURE: rounded shoulders and forward head  PALPATION: TTP: right knee medial joint line, patellar tendon, lateral hamstring insertion, and greater trochanter   JOINT MOBILITY:  Lumbar spine: hypomobile with L1-2 causing left shoulder burning  LOWER EXTREMITY ROM: unable to be assessed due to pain and positional intolerance to supine  LOWER EXTREMITY MMT:  MMT Right eval Right 04/26/22 Left eval  Hip flexion 3/5; limited by familiar pain 3+/5 4-/5  Hip  extension     Hip abduction     Hip adduction     Hip internal rotation     Hip external rotation     Knee flexion 4-/5 4/5 4/5  Knee extension 4-/5 4-/5; pain in right groin 4/5  Ankle dorsiflexion     Ankle plantarflexion     Ankle inversion     Ankle eversion      (Blank rows = not tested)  LOWER EXTREMITY SPECIAL TESTS:  Unable to be assessed due to increased pain in supine Passive hip IR and ER reproduced  familiar pain   FUNCTIONAL TESTS:  Significant difficulty with sit to stand transfers  GAIT: Assistive device utilized: Single point cane Level of assistance: Modified independence Comments: decreased gait speed, stride length, minimal knee flexion bilaterally, with absent heel strike and toe off   TODAY'S TREATMENT:                                                                                                                              DATE:                                     5/2 EXERCISE LOG  Exercise Repetitions and Resistance Comments  Nustep  L2 x 20 minutes   Stepping over cones  10 reps each foot leading  For improved foot clearance  Lunges onto step  6" step x 3 minutes  RLE on step  LAQ 3# x 3 minutes   Seated marching  3# x 2 minutes    Seated hip ADD isometric  2 minutes w/ 5 second hold    Blank cell = exercise not performed today  Modalities: no redness or adverse reaction to today's modalities  Date:  Unattended Estim: right quadriceps, pre mod @ 80-150 Hz, 15 mins, Pain Vaso: right thigh, 34 degrees; low pressure, 15 mins, Pain                                   4/25 EXERCISE LOG  Exercise Repetitions and Resistance Comments  Nustep  L2 x 20 minutes    Seated hip ADD isometric 3 minutes w/ 5 second hold    Hip ABD isometric  2 minutes w/ 5 second hold  With therapist assistance  Seated hip extension isometric  3 minutes w/ 5 second hold         Blank cell = exercise not performed today  Modalities  Date:  Unattended Estim: right quadriceps, IFC @ 80-150 Hz w/ 40% scan, 15 mins, Pain Vaso: Knee, 34 degrees; low pressure, 15 mins, Pain                                   4/22  EXERCISE LOG  Exercise Repetitions and Resistance Comments  Nustep  L1 x 20 minutes    Seated hip ADD isometric  3 minutes w/ 5 second hold    LAQ 15 reps     Blank cell = exercise not performed today  Modalities: no redness or adverse reaction to today's modalities  Date:   Unattended Estim: right quadriceps , IFC @ 80-150 Hz w/ 40% scan, 15 mins, Pain Vaso: right thigh, 34 degrees; low pressure, 15 mins, Pain  PATIENT EDUCATION:  Education details: MD follow up, HEP, and anatomy Person educated: Patient Education method: Explanation Education comprehension: verbalized understanding  HOME EXERCISE PROGRAM: [id number], [id number] on 05/10/22  ASSESSMENT:  CLINICAL IMPRESSION: Patient was progressed with multiple new and familiar interventions for improved lower extremity strength and reduced pain. She experienced a mild increase in right hip discomfort with activities such as stepping over cones and seated hip flexion, but this was able to be relieved with lunges onto a step. She reported feeling good upon the conclusion of treatment. She continues to require skilled physical therapy to address her remaining impairments to return to her prior level of function.   OBJECTIVE IMPAIRMENTS: Abnormal gait, decreased activity tolerance, decreased knowledge of condition, decreased mobility, difficulty walking, decreased ROM, decreased strength, hypomobility, postural dysfunction, and pain.   ACTIVITY LIMITATIONS: carrying, lifting, bending, standing, squatting, sleeping, stairs, transfers, and locomotion level  PARTICIPATION LIMITATIONS: meal prep, cleaning, laundry, shopping, and community activity  PERSONAL FACTORS: 3+ comorbidities: HTN, OA, morbid obesity, and neuropathy  are also affecting patient's functional outcome.   REHAB POTENTIAL: Fair    CLINICAL DECISION MAKING: Evolving/moderate complexity  EVALUATION COMPLEXITY: Moderate   GOALS: Goals reviewed with patient? Yes  LONG TERM GOALS: Target date: 05/07/22  Patient will be independent with her HEP.  Baseline: Patient reports that she is doing her HEP about 3 times per day  Goal status: IN PROGRESS  2.  Patient will be able to complete her daily activities without her familiar pain exceeding  6/10. Baseline:  Goal status: IN PROGRESS  3.  Patient will be able to walk for at least 10 minutes without being limited by her familiar symptoms.  Baseline: most she walks at one time is to her mailbox and back Goal status: IN PROGRESS  4.  Patient will be able to demonstrate at least 4-/5 right hip flexor strength without being limited by her familiar symptoms for improved function with her daily activities. Baseline: 3+/5 Goal status: IN PROGRESS  PLAN:  PT FREQUENCY: 1-2x/week  PT DURATION: 4 weeks  PLANNED INTERVENTIONS: Therapeutic exercises, Therapeutic activity, Neuromuscular re-education, Balance training, Gait training, Patient/Family education, Self Care, Joint mobilization, Stair training, Electrical stimulation, Cryotherapy, Moist heat, Vasopneumatic device, Manual therapy, and Re-evaluation  PLAN FOR NEXT SESSION: nustep, light hip AROM and strengthening, and modalities as needed   Granville Lewis, PT 05/17/2022, 10:03 AM

## 2022-05-18 ENCOUNTER — Encounter: Payer: Self-pay | Admitting: Family Medicine

## 2022-05-18 ENCOUNTER — Ambulatory Visit (INDEPENDENT_AMBULATORY_CARE_PROVIDER_SITE_OTHER): Payer: 59 | Admitting: Family Medicine

## 2022-05-18 VITALS — BP 143/85 | HR 79 | Temp 96.1°F | Ht 75.0 in | Wt 338.0 lb

## 2022-05-18 DIAGNOSIS — E785 Hyperlipidemia, unspecified: Secondary | ICD-10-CM | POA: Diagnosis not present

## 2022-05-18 DIAGNOSIS — R413 Other amnesia: Secondary | ICD-10-CM | POA: Diagnosis not present

## 2022-05-18 DIAGNOSIS — R2242 Localized swelling, mass and lump, left lower limb: Secondary | ICD-10-CM | POA: Diagnosis not present

## 2022-05-18 DIAGNOSIS — E039 Hypothyroidism, unspecified: Secondary | ICD-10-CM | POA: Diagnosis not present

## 2022-05-18 DIAGNOSIS — I10 Essential (primary) hypertension: Secondary | ICD-10-CM

## 2022-05-18 DIAGNOSIS — E8881 Metabolic syndrome: Secondary | ICD-10-CM

## 2022-05-18 DIAGNOSIS — Z6841 Body Mass Index (BMI) 40.0 and over, adult: Secondary | ICD-10-CM

## 2022-05-18 NOTE — Progress Notes (Signed)
Acute Office Visit  Subjective:  Patient ID: Dana Koch, female    DOB: 03/22/1955, 67 y.o.   MRN: [id number]  Chief Complaint  Patient presents with   Hyperlipidemia    Discuss lab result and treatment options    HPI Patient is in today to follow up on chronic conditions   HLD  Has been on zetia for some time. Is not able to tolerate statins. States that they make her want to "rip my skin off"  Reports that exericse has been nonexistent because her leg has been bothering her. She is gong to PT to work this. Reports that she eats "too much" in a day. States that she is "old, tired, and everything hurts" so that is her pleasure.   HTN  Has not been monitoring at home. Has wrist cuff at home. Denies changes to vision, palpitations, new leg edema, DOE.   Memory Reports that her memory is bad due to at history of trauma. States that she has to keep a calendar of all of her rides to take her places and gets frustrated with calling for rides. Reports that she feels it is getting worse.   Prediabetes and Thyroid dysfunction managed by Endocrinology  Patient clarified today that she used to be seen by Vascular, but is not currently seen by Cardiology.  ROS As per HPI   Objective:  BP (!) 143/85   Pulse 79   Temp (!) 96.1 F (35.6 C) (Temporal)   Ht 6\' 3"  (1.905 m)   Wt (!) 338 lb (153.3 kg)   SpO2 93%   BMI 42.25 kg/m    Physical Exam Constitutional:      General: She is not in acute distress.    Appearance: Normal appearance. She is obese. She is not ill-appearing, toxic-appearing or diaphoretic.  Cardiovascular:     Rate and Rhythm: Normal rate and regular rhythm.     Pulses: Normal pulses.     Heart sounds: Normal heart sounds. No murmur heard. Pulmonary:     Effort: Pulmonary effort is normal. No respiratory distress.     Breath sounds: Normal breath sounds. No stridor. No wheezing, rhonchi or rales.  Musculoskeletal:     Left lower leg: Deformity and tenderness  present. 1+ Edema present.  Skin:    Capillary Refill: Capillary refill takes less than 2 seconds.     Findings: Ecchymosis and erythema present.       Neurological:     General: No focal deficit present.     Mental Status: She is alert and oriented to person, place, and time.     GCS: GCS eye subscore is 4. GCS verbal subscore is 5. GCS motor subscore is 6.     Cranial Nerves: No cranial nerve deficit.     Sensory: No sensory deficit.     Motor: No weakness.     Coordination: Coordination normal.     Gait: Gait abnormal.  Psychiatric:        Attention and Perception: Attention normal. She is attentive. She does not perceive auditory hallucinations.        Mood and Affect: Mood normal.        Speech: Speech normal.        Behavior: Behavior normal. Behavior is cooperative.        Thought Content: Thought content normal.        Cognition and Memory: Memory is impaired.        Judgment: Judgment normal.  05/18/2022   10:16 AM 11/19/2018    9:29 AM  MMSE - Mini Mental State Exam  Orientation to time 3 5  Orientation to Place 5 5  Registration 3 3  Attention/ Calculation 5 5  Recall 2 3  Language- name 2 objects 2 2  Language- repeat 1 1  Language- follow 3 step command 3 3  Language- read & follow direction 1 1  Write a sentence 1 1  Copy design 1 1  Total score 27 30       05/18/2022    9:26 AM 04/24/2022    8:09 AM 12/26/2021    8:40 AM  Depression screen PHQ 2/9  Decreased Interest 0 0 0  Down, Depressed, Hopeless 0 0 0  PHQ - 2 Score 0 0 0  Altered sleeping 0 0 0  Tired, decreased energy 1 0 0  Change in appetite 1 0 0  Feeling bad or failure about yourself  0 0 0  Trouble concentrating 0 0 0  Moving slowly or fidgety/restless 0 0 0  Suicidal thoughts 0 0 0  PHQ-9 Score 2 0 0  Difficult doing work/chores Not difficult at all  Not difficult at all      05/18/2022    9:26 AM 04/24/2022    8:09 AM 12/26/2021    8:40 AM 06/13/2021    9:43 AM  GAD 7 :  Generalized Anxiety Score  Nervous, Anxious, on Edge 0 0 0 0  Control/stop worrying 0 0 0 0  Worry too much - different things 0 0 0 0  Trouble relaxing 2 0 0 0  Restless 0 0 0 0  Easily annoyed or irritable 2 0 0 0  Afraid - awful might happen 0 0 0 0  Total GAD 7 Score 4 0 0 0  Anxiety Difficulty Not difficult at all  Not difficult at all     Assessment & Plan:  1. Primary hypertension Not at goal. Patient states that it is elevated today due to stress trying to catch the bus to make her appointment. Requested home log. Patient has access to wrist monitor. Will obtain home measurements and determine plan. Referral as below to assist with management of care.  - AMB Referral to Chronic Care Management Services  2. Acquired hypothyroidism Managed by endo. Last laboratory results within goal.  - AMB Referral to Chronic Care Management Services  3. Morbid obesity (HCC) Discussed with patient to continue healthy lifestyle choices, including diet (rich in fruits, vegetables, and lean proteins, and low in salt and simple carbohydrates) and exercise (at least 30 minutes of moderate physical activity daily). Limit beverages high is sugar. Recommended at least 80-100 oz of water daily. Patient is limited by physical impairments.  - AMB Referral to Chronic Care Management Services  4. Dyslipidemia, goal LDL below 100 Referral placed as below for pharmacy to assist with order for PCSK9 inhibitor.  - AMB Referral to Chronic Care Management Services  5. Metabolic syndrome Working to obtain control of obesity, hypertension, hyperlipidemia.  - AMB Referral to Chronic Care Management Services  6. Impaired memory MMSE completed today without significant findings or significant decrease from prior. Recent labs within normal limits. Will continue with further eval and discuss with patient if she would like to start medication such as Aricept in the future.   7. Localized swelling of left lower  extremity Continues to have lower extremity edema. Patient is continuing to follow with PT. Reports that she does not  elevate her legs frequently. She is not following with Cardiology or Vascular at this time. She had an ABI 03/17/21 with [facility] that the results are not available for.  Plan for patient to follow up with vascular, Dr. Ronda Fairly.    The above assessment and management plan was discussed with the patient. The patient verbalized understanding of and has agreed to the management plan using shared-decision making. Patient is aware to call the clinic if they develop any new symptoms or if symptoms fail to improve or worsen. Patient is aware when to return to the clinic for a follow-up visit. Patient educated on when it is appropriate to go to the emergency department.   Neale Burly, DNP-FNP [facility] [address] [place], [place] [address] [phone]

## 2022-05-21 ENCOUNTER — Ambulatory Visit: Payer: 59

## 2022-05-21 DIAGNOSIS — M25551 Pain in right hip: Secondary | ICD-10-CM

## 2022-05-21 DIAGNOSIS — M79604 Pain in right leg: Secondary | ICD-10-CM | POA: Diagnosis not present

## 2022-05-21 DIAGNOSIS — M6281 Muscle weakness (generalized): Secondary | ICD-10-CM | POA: Diagnosis not present

## 2022-05-21 NOTE — Therapy (Signed)
OUTPATIENT PHYSICAL THERAPY LOWER EXTREMITY TREATMENT   Patient Name: Dana Koch MRN: [id number] DOB:Mar 22, 1955, 67 y.o., female Today's Date: 05/21/2022  END OF SESSION:  PT End of Session - 05/21/22 0837     Visit Number 11    Number of Visits 12    Date for PT Re-Evaluation 06/15/22    PT Start Time 0835    PT Stop Time 0927    PT Time Calculation (min) 52 min    Activity Tolerance Patient tolerated treatment well    Behavior During Therapy [facility] for tasks assessed/performed               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 02/10/2018   [facility] +HPV, will schedule colpo with Dr Emelda Fear    Hyperlipidemia    Hypertension    Hypothyroidism    Lymphedema    Neuropathy    Prediabetes    Venous stasis    Past Surgical History:  Procedure Laterality Date   CHOLECYSTECTOMY  01/16/1987   LEG SURGERY Left    broken left leg   TOTAL ABDOMINAL HYSTERECTOMY  2020   Patient Active Problem List   Diagnosis Date Noted   Lung cancer screening declined by patient 12/26/2021   Colon cancer screening declined 12/26/2021   Mammogram declined 12/26/2021   Neuropathic ulcer of foot (HCC) 11/17/2020   Metabolic syndrome 05/03/2020   Arthritis 05/03/2020   Prediabetes 05/03/2020   Dyslipidemia, goal LDL below 100 03/20/2018   Morbid obesity (HCC) 06/26/2017   Primary hypertension 02/20/2016   Hypothyroidism 02/20/2016   REFERRING PROVIDER: Case, [place], MD  REFERRING DIAG: Right knee pain, unspecified chronicity  THERAPY DIAG:  Pain in right hip  Pain in right leg  Muscle weakness (generalized)  Rationale for Evaluation and Treatment: Rehabilitation  ONSET DATE: 02/26/22  SUBJECTIVE:   SUBJECTIVE STATEMENT: Patient reports that she is sore. She thinks that she may have "overdid it" at her last appointment.   PERTINENT HISTORY: HTN, OA, morbid obesity, and neuropathy PAIN:   Are you having pain? Yes: NPRS scale: 5/10 Pain location: right groin and quadriceps Pain description: constant ache with intermittent throbbing Aggravating factors: transfers, walking Relieving factors: none known   PRECAUTIONS: None  WEIGHT BEARING RESTRICTIONS: No  FALLS:  Has patient fallen in last 6 months? No  LIVING ENVIRONMENT: Lives with: lives alone Lives in: House/apartment Stairs: Yes: External: 1 steps; none Has following equipment at home: Single point cane and Walker - 2 wheeled  OCCUPATION: retired  PLOF: Independent  PATIENT GOALS: reduced pain, and be able to walk longer   NEXT MD VISIT: none scheduled  OBJECTIVE:   PATIENT SURVEYS:  FOTO 56.54 on 05/17/22  COGNITION: Overall cognitive status: Within functional limits for tasks assessed     SENSATION: Patient has neuropathy in bilateral lower extremities  POSTURE: rounded shoulders and forward head  PALPATION: TTP: right knee medial joint line, patellar tendon, lateral hamstring insertion, and greater trochanter   JOINT MOBILITY:  Lumbar spine: hypomobile with L1-2 causing left shoulder burning  LOWER EXTREMITY ROM: unable to be assessed due to pain and positional intolerance to supine  LOWER EXTREMITY MMT:  MMT Right eval Right 04/26/22 Left eval  Hip flexion 3/5; limited by familiar pain 3+/5 4-/5  Hip extension     Hip abduction     Hip adduction     Hip internal rotation     Hip external rotation  Knee flexion 4-/5 4/5 4/5  Knee extension 4-/5 4-/5; pain in right groin 4/5  Ankle dorsiflexion     Ankle plantarflexion     Ankle inversion     Ankle eversion      (Blank rows = not tested)  LOWER EXTREMITY SPECIAL TESTS:  Unable to be assessed due to increased pain in supine Passive hip IR and ER reproduced familiar pain   FUNCTIONAL TESTS:  Significant difficulty with sit to stand transfers  GAIT: Assistive device utilized: Single point cane Level of assistance: Modified  independence Comments: decreased gait speed, stride length, minimal knee flexion bilaterally, with absent heel strike and toe off   TODAY'S TREATMENT:                                                                                                                              DATE:                                     5/6 EXERCISE LOG  Exercise Repetitions and Resistance Comments  Nustep  L2 x 20 minutes   Standing hip flexion  2 minutes  Alternating LE; BUE support  Standing hip extension  10 reps each  Limited by "groin pull" in right hip   Standing HS curl  2 minutes Alternating LE; BUE support       Blank cell = exercise not performed today  Modalities; no redness or adverse reaction to today's modalities  Date:  Unattended Estim: right quadriceps, pre mod @ 80-150 Hz, 15 mins, Pain Vaso: Knee, 34 degrees; low pressure, 15 mins, Pain                                   5/2 EXERCISE LOG  Exercise Repetitions and Resistance Comments  Nustep  L2 x 20 minutes   Stepping over cones  10 reps each foot leading  For improved foot clearance  Lunges onto step  6" step x 3 minutes  RLE on step  LAQ 3# x 3 minutes   Seated marching  3# x 2 minutes    Seated hip ADD isometric  2 minutes w/ 5 second hold    Blank cell = exercise not performed today  Modalities: no redness or adverse reaction to today's modalities  Date:  Unattended Estim: right quadriceps, pre mod @ 80-150 Hz, 15 mins, Pain Vaso: right thigh, 34 degrees; low pressure, 15 mins, Pain                                   4/25 EXERCISE LOG  Exercise Repetitions and Resistance Comments  Nustep  L2 x 20 minutes    Seated hip ADD isometric 3 minutes w/ 5 second hold  Hip ABD isometric  2 minutes w/ 5 second hold  With therapist assistance  Seated hip extension isometric  3 minutes w/ 5 second hold         Blank cell = exercise not performed today  Modalities  Date:  Unattended Estim: right quadriceps, IFC @ 80-150 Hz w/  40% scan, 15 mins, Pain Vaso: Knee, 34 degrees; low pressure, 15 mins, Pain  PATIENT EDUCATION:  Education details: MD follow up, HEP, and anatomy Person educated: Patient Education method: Explanation Education comprehension: verbalized understanding  HOME EXERCISE PROGRAM: [id number], [id number] on 05/10/22  ASSESSMENT:  CLINICAL IMPRESSION: Patient presented to treatment with right lower extremity soreness since her last appointment on 05/17/22. Treatment focused on standing interventions for improved lower extremity strength without aggravating her familiar symptoms. She experienced an increase in right hip pain when standing hip extension was attempted. However, this did not inhibit her ability to complete any of today's other interventions. She reported feeling better upon the conclusion of treatment. She continues to require skilled physical therapy to address her remaining impairments to maximize her functional mobility.   OBJECTIVE IMPAIRMENTS: Abnormal gait, decreased activity tolerance, decreased knowledge of condition, decreased mobility, difficulty walking, decreased ROM, decreased strength, hypomobility, postural dysfunction, and pain.   ACTIVITY LIMITATIONS: carrying, lifting, bending, standing, squatting, sleeping, stairs, transfers, and locomotion level  PARTICIPATION LIMITATIONS: meal prep, cleaning, laundry, shopping, and community activity  PERSONAL FACTORS: 3+ comorbidities: HTN, OA, morbid obesity, and neuropathy  are also affecting patient's functional outcome.   REHAB POTENTIAL: Fair    CLINICAL DECISION MAKING: Evolving/moderate complexity  EVALUATION COMPLEXITY: Moderate   GOALS: Goals reviewed with patient? Yes  LONG TERM GOALS: Target date: 05/07/22  Patient will be independent with her HEP.  Baseline: Patient reports that she is doing her HEP about 3 times per day  Goal status: IN PROGRESS  2.  Patient will be able to complete her daily activities without  her familiar pain exceeding 6/10. Baseline:  Goal status: IN PROGRESS  3.  Patient will be able to walk for at least 10 minutes without being limited by her familiar symptoms.  Baseline: most she walks at one time is to her mailbox and back Goal status: IN PROGRESS  4.  Patient will be able to demonstrate at least 4-/5 right hip flexor strength without being limited by her familiar symptoms for improved function with her daily activities. Baseline: 3+/5 Goal status: IN PROGRESS  PLAN:  PT FREQUENCY: 1-2x/week  PT DURATION: 4 weeks  PLANNED INTERVENTIONS: Therapeutic exercises, Therapeutic activity, Neuromuscular re-education, Balance training, Gait training, Patient/Family education, Self Care, Joint mobilization, Stair training, Electrical stimulation, Cryotherapy, Moist heat, Vasopneumatic device, Manual therapy, and Re-evaluation  PLAN FOR NEXT SESSION: nustep, light hip AROM and strengthening, and modalities as needed   Granville Lewis, PT 05/21/2022, 9:29 AM

## 2022-05-24 ENCOUNTER — Telehealth: Payer: Self-pay

## 2022-05-24 NOTE — Progress Notes (Signed)
  Chronic Care Management   Note  05/24/2022 Name: Marthe Baran MRN: [id number] DOB: [birth year]  Dana Koch is a 67 y.o. year old female who is a primary care patient of Ellamae Sia, Aleen Campi, FNP. I reached out to [name] by phone today in response to a referral sent by Ms. Marvell Fuller PCP.  Ms. Carns was given information about Chronic Care Management services today including:  CCM service includes personalized support from designated clinical staff supervised by the physician, including individualized plan of care and coordination with other care providers 24/7 contact phone numbers for assistance for urgent and routine care needs. Service will only be billed when office clinical staff spend 20 minutes or more in a month to coordinate care. Only one practitioner may furnish and bill the service in a calendar month. The patient may stop CCM services at amy time (effective at the end of the month) by phone call to the office staff. The patient will be responsible for cost sharing (co-pay) or up to 20% of the service fee (after annual deductible is met)  Ms. Zyrielle Hazard  agreedto scheduling an appointment with the CCM RN Case Manager   Follow up plan: Patient agreed to scheduled appointment with RN Case Manager on 05/29/2022 Pharm d 06/29/2022(date/time).   Penne Lash, RMA Care Guide [facility]  [place], [place] [address] Direct Dial: [phone] [email or web address]

## 2022-05-28 ENCOUNTER — Encounter: Payer: Self-pay | Admitting: Family Medicine

## 2022-05-28 ENCOUNTER — Ambulatory Visit: Payer: 59

## 2022-05-28 DIAGNOSIS — M25551 Pain in right hip: Secondary | ICD-10-CM

## 2022-05-28 DIAGNOSIS — M79604 Pain in right leg: Secondary | ICD-10-CM | POA: Diagnosis not present

## 2022-05-28 DIAGNOSIS — M6281 Muscle weakness (generalized): Secondary | ICD-10-CM

## 2022-05-28 NOTE — Therapy (Addendum)
OUTPATIENT PHYSICAL THERAPY LOWER EXTREMITY TREATMENT   Patient Name: Dana Koch MRN: [id number] DOB:12-Dec-1955, 67 y.o., female Today's Date: 05/28/2022  END OF SESSION:  PT End of Session - 05/28/22 0859     Visit Number 12    Number of Visits 12    Date for PT Re-Evaluation 06/15/22    PT Start Time 0900    PT Stop Time 0945    PT Time Calculation (min) 45 min    Activity Tolerance Patient limited by pain    Behavior During Therapy [facility] for tasks assessed/performed               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 02/10/2018   [facility] +HPV, will schedule colpo with Dr Emelda Fear    Hyperlipidemia    Hypertension    Hypothyroidism    Lymphedema    Neuropathy    Prediabetes    Venous stasis    Past Surgical History:  Procedure Laterality Date   CHOLECYSTECTOMY  01/16/1987   LEG SURGERY Left    broken left leg   TOTAL ABDOMINAL HYSTERECTOMY  2020   Patient Active Problem List   Diagnosis Date Noted   Lung cancer screening declined by patient 12/26/2021   Colon cancer screening declined 12/26/2021   Mammogram declined 12/26/2021   Neuropathic ulcer of foot (HCC) 11/17/2020   Metabolic syndrome 05/03/2020   Arthritis 05/03/2020   Prediabetes 05/03/2020   Dyslipidemia, goal LDL below 100 03/20/2018   Morbid obesity (HCC) 06/26/2017   Primary hypertension 02/20/2016   Hypothyroidism 02/20/2016   REFERRING PROVIDER: Case, [place], MD  REFERRING DIAG: Right knee pain, unspecified chronicity  THERAPY DIAG:  Pain in right hip  Pain in right leg  Muscle weakness (generalized)  Rationale for Evaluation and Treatment: Rehabilitation  ONSET DATE: 02/26/22  SUBJECTIVE:   SUBJECTIVE STATEMENT: Patient reports that she has gone backwards as her leg is bothering her more. She notes that it was getting better until last week, but last week was "hell." She notes that she  felt good for the rest of the day after her last appointment.   PERTINENT HISTORY: HTN, OA, morbid obesity, and neuropathy PAIN:  Are you having pain? Yes: NPRS scale: 7/10 Pain location: right groin and quadriceps Pain description: constant ache with intermittent throbbing Aggravating factors: transfers, walking Relieving factors: none known   PRECAUTIONS: None  WEIGHT BEARING RESTRICTIONS: No  FALLS:  Has patient fallen in last 6 months? No  LIVING ENVIRONMENT: Lives with: lives alone Lives in: House/apartment Stairs: Yes: External: 1 steps; none Has following equipment at home: Single point cane and Walker - 2 wheeled  OCCUPATION: retired  PLOF: Independent  PATIENT GOALS: reduced pain, and be able to walk longer   NEXT MD VISIT: none scheduled  OBJECTIVE:   PATIENT SURVEYS:  FOTO 56.54 on 05/17/22  COGNITION: Overall cognitive status: Within functional limits for tasks assessed     SENSATION: Patient has neuropathy in bilateral lower extremities  POSTURE: rounded shoulders and forward head  PALPATION: TTP: right knee medial joint line, patellar tendon, lateral hamstring insertion, and greater trochanter   JOINT MOBILITY:  Lumbar spine: hypomobile with L1-2 causing left shoulder burning  LOWER EXTREMITY ROM: unable to be assessed due to pain and positional intolerance to supine  LOWER EXTREMITY MMT:  MMT Right eval Right 04/26/22 Left eval  Hip flexion 3/5; limited by familiar pain 3+/5 4-/5  Hip  extension     Hip abduction     Hip adduction     Hip internal rotation     Hip external rotation     Knee flexion 4-/5 4/5 4/5  Knee extension 4-/5 4-/5; pain in right groin 4/5  Ankle dorsiflexion     Ankle plantarflexion     Ankle inversion     Ankle eversion      (Blank rows = not tested)  LOWER EXTREMITY SPECIAL TESTS:  Unable to be assessed due to increased pain in supine Passive hip IR and ER reproduced familiar pain   FUNCTIONAL TESTS:   Significant difficulty with sit to stand transfers  GAIT: Assistive device utilized: Single point cane Level of assistance: Modified independence Comments: decreased gait speed, stride length, minimal knee flexion bilaterally, with absent heel strike and toe off   TODAY'S TREATMENT:                                                                                                                              DATE:                                     5/13 EXERCISE LOG  Exercise Repetitions and Resistance Comments  Nustep  L2 x 20 minutes                    Blank cell = exercise not performed today  Modalities: no redness or adverse reaction to today's modalities  Date:  Unattended Estim: right quadriceps, IFC @ 80-150 Hz w/ 40% scan, 15 mins, Pain Vaso: Knee, 34 degrees; low pressure, 15 mins, Pain                                   5/6 EXERCISE LOG  Exercise Repetitions and Resistance Comments  Nustep  L2 x 20 minutes   Standing hip flexion  2 minutes  Alternating LE; BUE support  Standing hip extension  10 reps each  Limited by "groin pull" in right hip   Standing HS curl  2 minutes Alternating LE; BUE support       Blank cell = exercise not performed today  Modalities; no redness or adverse reaction to today's modalities  Date:  Unattended Estim: right quadriceps, pre mod @ 80-150 Hz, 15 mins, Pain Vaso: Knee, 34 degrees; low pressure, 15 mins, Pain                                   5/2 EXERCISE LOG  Exercise Repetitions and Resistance Comments  Nustep  L2 x 20 minutes   Stepping over cones  10 reps each foot leading  For improved foot clearance  Lunges onto step  6" step x  3 minutes  RLE on step  LAQ 3# x 3 minutes   Seated marching  3# x 2 minutes    Seated hip ADD isometric  2 minutes w/ 5 second hold    Blank cell = exercise not performed today  Modalities: no redness or adverse reaction to today's modalities  Date:  Unattended Estim: right quadriceps, pre  mod @ 80-150 Hz, 15 mins, Pain Vaso: right thigh, 34 degrees; low pressure, 15 mins, Pain  PATIENT EDUCATION:  Education details: MD follow up, HEP, and anatomy Person educated: Patient Education method: Explanation Education comprehension: verbalized understanding  HOME EXERCISE PROGRAM: [id number], [id number] on 05/10/22  ASSESSMENT:  CLINICAL IMPRESSION: Patient presented to treatment with elevated pain levels since her previous appointment. Treatment focused on familiar interventions for reduced pain and improved mobility. She was educated on her progress with skilled physical therapy. She was encouraged to contact her physician regarding her continued pain. She reported feeling about the same upon the conclusion of treatment. She may require additional medical intervention to address her impairments to return to her prior level of function.   PHYSICAL THERAPY DISCHARGE SUMMARY  Visits from Start of Care: 12  Current functional level related to goals / functional outcomes: Patient was unable to meet all of her goals for skilled physical therapy.    Remaining deficits: Pain and lower extremity weakness   Education / Equipment: HEP   Patient agrees to discharge. Patient goals were partially met. Patient is being discharged due to maximized rehab potential.   Candi Leash, PT, DPT    OBJECTIVE IMPAIRMENTS: Abnormal gait, decreased activity tolerance, decreased knowledge of condition, decreased mobility, difficulty walking, decreased ROM, decreased strength, hypomobility, postural dysfunction, and pain.   ACTIVITY LIMITATIONS: carrying, lifting, bending, standing, squatting, sleeping, stairs, transfers, and locomotion level  PARTICIPATION LIMITATIONS: meal prep, cleaning, laundry, shopping, and community activity  PERSONAL FACTORS: 3+ comorbidities: HTN, OA, morbid obesity, and neuropathy  are also affecting patient's functional outcome.   REHAB POTENTIAL: Fair    CLINICAL  DECISION MAKING: Evolving/moderate complexity  EVALUATION COMPLEXITY: Moderate   GOALS: Goals reviewed with patient? Yes  LONG TERM GOALS: Target date: 05/07/22  Patient will be independent with her HEP.  Baseline: Patient reports that she is doing her HEP about 3 times per day  Goal status: MET  2.  Patient will be able to complete her daily activities without her familiar pain exceeding 6/10. Baseline:  Goal status: IN PROGRESS  3.  Patient will be able to walk for at least 10 minutes without being limited by her familiar symptoms.  Baseline: most she walks at one time is to her mailbox and back Goal status: IN PROGRESS  4.  Patient will be able to demonstrate at least 4-/5 right hip flexor strength without being limited by her familiar symptoms for improved function with her daily activities. Baseline: 3+/5 Goal status: IN PROGRESS  PLAN:  PT FREQUENCY: 1-2x/week  PT DURATION: 4 weeks  PLANNED INTERVENTIONS: Therapeutic exercises, Therapeutic activity, Neuromuscular re-education, Balance training, Gait training, Patient/Family education, Self Care, Joint mobilization, Stair training, Electrical stimulation, Cryotherapy, Moist heat, Vasopneumatic device, Manual therapy, and Re-evaluation  PLAN FOR NEXT SESSION: nustep, light hip AROM and strengthening, and modalities as needed   Granville Lewis, PT 05/28/2022, 10:05 AM

## 2022-05-29 ENCOUNTER — Ambulatory Visit (INDEPENDENT_AMBULATORY_CARE_PROVIDER_SITE_OTHER): Payer: 59 | Admitting: *Deleted

## 2022-05-29 DIAGNOSIS — I10 Essential (primary) hypertension: Secondary | ICD-10-CM

## 2022-05-29 DIAGNOSIS — E785 Hyperlipidemia, unspecified: Secondary | ICD-10-CM

## 2022-05-29 NOTE — Plan of Care (Signed)
Chronic Care Management Provider Comprehensive Care Plan    05/29/2022 Name: Dana Koch MRN: [id number] DOB: 02/14/1955  Referral to Chronic Care Management (CCM) services was placed by Provider:  Neale Burly FNP on Date: 05/18/22.  Chronic Condition 1: HYPERTENSION Provider Assessment and Plan Primary hypertension Not at goal. Patient states that it is elevated today due to stress trying to catch the bus to make her appointment. Requested home log. Patient has access to wrist monitor. Will obtain home measurements and determine plan. Referral as below to assist with management of care.  - AMB Referral to Chronic Care Management Services   Expected Outcome/Goals Addressed This Visit (Provider CCM goals/Provider Assessment and plan  CCM (HYPERTENSION) EXPECTED OUTCOME: SELF-MANAGE AND REDUCE SYMPTOMS OF HYPERTENSION  Symptom Management Condition 1: Take medications as prescribed   Attend all scheduled provider appointments Call pharmacy for medication refills 3-7 days in advance of running out of medications Attend church or other social activities Perform all self care activities independently  Perform IADL's (shopping, preparing meals, housekeeping, managing finances) independently Call provider office for new concerns or questions  check blood pressure weekly choose a place to take my blood pressure (home, clinic or office, retail store) write blood pressure results in a log or diary learn about high blood pressure keep a blood pressure log take blood pressure log to all doctor appointments call doctor for signs and symptoms of high blood pressure keep all doctor appointments take medications for blood pressure exactly as prescribed report new symptoms to your doctor eat more whole grains, fruits and vegetables, lean meats and healthy fats Look over education in my chart- low sodium diet Advanced directives packet mailed- please complete and have notarized  Chronic  Condition 2: HYPERLIPIDEMIA Provider Assessment and Plan Dyslipidemia, goal LDL below 100 Referral placed as below for pharmacy to assist with order for PCSK9 inhibitor.  - AMB Referral to Chronic Care Management Services   Expected Outcome/Goals Addressed This Visit (Provider CCM goals/Provider Assessment and plan  CCM (HYPERLIPIDEMIA) EXPECTED OUTCOME: MONITOR, SELF-MANAGE AND REDUCE SYMPTOMS OF HYPERLIPIDEMIA  Symptom Management Condition 2: Attend church or other social activities Perform all self care activities independently  Perform IADL's (shopping, preparing meals, housekeeping, managing finances) independently Call provider office for new concerns or questions  - call for medicine refill 2 or 3 days before it runs out - take all medications exactly as prescribed - call doctor with any symptoms you believe are related to your medicine - call doctor when you experience any new symptoms - go to all doctor appointments as scheduled - adhere to prescribed diet: heart healthy - develop an exercise routine Look over education in my chart- heart healthy diet  Problem List Patient Active Problem List   Diagnosis Date Noted   Lung cancer screening declined by patient 12/26/2021   Colon cancer screening declined 12/26/2021   Mammogram declined 12/26/2021   Neuropathic ulcer of foot (HCC) 11/17/2020   Metabolic syndrome 05/03/2020   Arthritis 05/03/2020   Prediabetes 05/03/2020   Dyslipidemia, goal LDL below 100 03/20/2018   Morbid obesity (HCC) 06/26/2017   Primary hypertension 02/20/2016   Hypothyroidism 02/20/2016    Medication Management  Current Outpatient Medications:    Cyanocobalamin (B-12) 1000 MCG CAPS, Take by mouth., Disp: , Rfl:    ezetimibe (ZETIA) 10 MG tablet, Take 1 tablet (10 mg total) by mouth daily., Disp: 90 tablet, Rfl: 0   hydrochlorothiazide (HYDRODIURIL) 25 MG tablet, Take 1 tablet (25 mg total) by mouth daily.,  Disp: 90 tablet, Rfl: 0    levothyroxine (SYNTHROID) 200 MCG tablet, Take 1 tablet (200 mcg total) by mouth daily., Disp: 90 tablet, Rfl: 1   liothyronine (CYTOMEL) 5 MCG tablet, Take 5 mcg by mouth daily., Disp: , Rfl:    Omega-3 Fatty Acids (FISH OIL) 1000 MG CAPS, , Disp: , Rfl:   Cognitive Assessment Identity Confirmed: : Name; DOB Cognitive Status: Normal   Functional Assessment Hearing Difficulty or Deaf: no Wear Glasses or Blind: yes Vision Management: reading glasses Concentrating, Remembering or Making Decisions Difficulty (CP): no Difficulty Communicating: no Difficulty Eating/Swallowing: no Walking or Climbing Stairs Difficulty: no Dressing/Bathing Difficulty: no Doing Errands Independently Difficulty (such as shopping) (CP): no   Caregiver Assessment  Primary Source of Support/Comfort: child(ren) Name of Support/Comfort Primary Source: daughter Phylliss Blakes People in Home: alone   Planned Interventions  Provider established cholesterol goals reviewed; Counseled on importance of regular laboratory monitoring as prescribed; Provided HLD educational materials; Reviewed role and benefits of statin for ASCVD risk reduction; Reviewed importance of limiting foods high in cholesterol; Reviewed exercise goals and target of 150 minutes per week; Screening for signs and symptoms of depression related to chronic disease state;  Assessed social determinant of health barriers;  Pain assessment completed Evaluation of current treatment plan related to hypertension self management and patient's adherence to plan as established by provider;   Reviewed prescribed diet low sodium Reviewed medications with patient and discussed importance of compliance;  Counseled on the importance of exercise goals with target of 150 minutes per week Discussed plans with patient for ongoing care management follow up and provided patient with direct contact information for care management team; Advised patient, providing  education and rationale, to monitor blood pressure daily and record, calling PCP for findings outside established parameters;  Provided education on prescribed diet low sodium;  Discussed complications of poorly controlled blood pressure such as heart disease, stroke, circulatory complications, vision complications, kidney impairment, sexual dysfunction;  Screening for signs and symptoms of depression related to chronic disease state;  Assessed social determinant of health barriers;  Advanced directives packet mailed  Interaction and coordination with outside resources, practitioners, and providers See CCM Referral  Care Plan: Available in MyChart

## 2022-05-29 NOTE — Chronic Care Management (AMB) (Signed)
Chronic Care Management   CCM RN Visit Note  05/29/2022 Name: Dana Koch MRN: [id number] DOB: 10/01/1955  Subjective: Dana Koch is a 67 y.o. year old female who is a primary care patient of Ellamae Sia, Aleen Campi, FNP. The patient was referred to the Chronic Care Management team for assistance with care management needs subsequent to provider initiation of CCM services and plan of care.    Today's Visit:  Engaged with patient by telephone for initial visit.     SDOH Interventions Today    Flowsheet Row Most Recent Value  SDOH Interventions   Food Insecurity Interventions Intervention Not Indicated  Housing Interventions Intervention Not Indicated  Transportation Interventions Intervention Not Indicated  Utilities Interventions Intervention Not Indicated  Financial Strain Interventions Intervention Not Indicated  Physical Activity Interventions Patient Declined, Other (Comments)  [pt states she is limited by pain in her legs/ osteoarthritis]  Stress Interventions Intervention Not Indicated  Social Connections Interventions Patient Declined         Goals Addressed             This Visit's Progress    CCM (HYPERLIPIDEMIA) EXPECTED OUTCOME: MONITOR, SELF-MANAGE AND REDUCE SYMPTOMS OF HYPERLIPIDEMIA       Current Barriers:  Knowledge Deficits related to Hyperlipidemia management Chronic Disease Management support and education needs related to Hyperlipidemia, diet, exercise Patient reports she has not been able to exercise as much due to osteoarthritis in her legs, attended outpatient PT and had last session yesterday, states she is not going back as she did not feel like it helped.  Patient reports she cooks and tries to eat healthy.  Patient reports she has all medications and taking as prescribed.  Planned Interventions: Provider established cholesterol goals reviewed; Counseled on importance of regular laboratory monitoring as prescribed; Provided HLD educational  materials; Reviewed role and benefits of statin for ASCVD risk reduction; Reviewed importance of limiting foods high in cholesterol; Reviewed exercise goals and target of 150 minutes per week; Screening for signs and symptoms of depression related to chronic disease state;  Assessed social determinant of health barriers;  Pain assessment completed  Symptom Management: Attend church or other social activities Perform all self care activities independently  Perform IADL's (shopping, preparing meals, housekeeping, managing finances) independently Call provider office for new concerns or questions  - call for medicine refill 2 or 3 days before it runs out - take all medications exactly as prescribed - call doctor with any symptoms you believe are related to your medicine - call doctor when you experience any new symptoms - go to all doctor appointments as scheduled - adhere to prescribed diet: heart healthy - develop an exercise routine Look over education in my chart- heart healthy diet  Follow Up Plan: Telephone follow up appointment with care management team member scheduled for:  08/15/22 at 3 pm       CCM (HYPERTENSION) EXPECTED OUTCOME: SELF-MANAGE AND REDUCE SYMPTOMS OF HYPERTENSION       Current Barriers:  Knowledge Deficits related to Hypertension management Care Coordination needs related to medication management in a patient with Hypertension Chronic Disease Management support and education needs related to Hypertension No Advanced Directives in place-pt requests documents be mailed Patient reports she lives alone in senior apartment complex, does not drive and states [facility] and [employer] provides adequate transportation for all appointments and errands, states her daughter lives in [place] and is nearby and can assist pt if needed. Patient reports she has a wrist cuff and  monitors blood pressure " on occasion"  Planned Interventions: Evaluation of current treatment plan  related to hypertension self management and patient's adherence to plan as established by provider;   Reviewed prescribed diet low sodium Reviewed medications with patient and discussed importance of compliance;  Counseled on the importance of exercise goals with target of 150 minutes per week Discussed plans with patient for ongoing care management follow up and provided patient with direct contact information for care management team; Advised patient, providing education and rationale, to monitor blood pressure daily and record, calling PCP for findings outside established parameters;  Provided education on prescribed diet low sodium;  Discussed complications of poorly controlled blood pressure such as heart disease, stroke, circulatory complications, vision complications, kidney impairment, sexual dysfunction;  Screening for signs and symptoms of depression related to chronic disease state;  Assessed social determinant of health barriers;  Advanced directives packet mailed  Symptom Management: Take medications as prescribed   Attend all scheduled provider appointments Call pharmacy for medication refills 3-7 days in advance of running out of medications Attend church or other social activities Perform all self care activities independently  Perform IADL's (shopping, preparing meals, housekeeping, managing finances) independently Call provider office for new concerns or questions  check blood pressure weekly choose a place to take my blood pressure (home, clinic or office, retail store) write blood pressure results in a log or diary learn about high blood pressure keep a blood pressure log take blood pressure log to all doctor appointments call doctor for signs and symptoms of high blood pressure keep all doctor appointments take medications for blood pressure exactly as prescribed report new symptoms to your doctor eat more whole grains, fruits and vegetables, lean meats and healthy  fats Look over education in my chart- low sodium diet Advanced directives packet mailed- please complete and have notarized  Follow Up Plan: Telephone follow up appointment with care management team member scheduled for:  08/15/22 at 3 pm            Plan:Telephone follow up appointment with care management team member scheduled for:  08/15/22 at 3 pm  Irving Shows [facility], BSN RN Case Manager [facility] [phone]

## 2022-05-29 NOTE — Patient Instructions (Signed)
Please call the care guide team at [phone] if you need to cancel or reschedule your appointment.   If you are experiencing a Mental Health or Behavioral Health Crisis or need someone to talk to, please call the Suicide and Crisis Lifeline: 988 call the [place] National Suicide Prevention Lifeline: (541)285-5943 or TTY: (780)715-7157 TTY 725-012-0952) to talk to a trained counselor call 1-800-273-TALK (toll free, 24 hour hotline) go to [facility] [address], [place] [phone]) call the [facility]: [phone] call 911   Following is a copy of the CCM Program Consent:  CCM service includes personalized support from designated clinical staff supervised by the physician, including individualized plan of care and coordination with other care providers 24/7 contact phone numbers for assistance for urgent and routine care needs. Service will only be billed when office clinical staff spend 20 minutes or more in a month to coordinate care. Only one practitioner may furnish and bill the service in a calendar month. The patient may stop CCM services at amy time (effective at the end of the month) by phone call to the office staff. The patient will be responsible for cost sharing (co-pay) or up to 20% of the service fee (after annual deductible is met)  Following is a copy of your full provider care plan:   Goals Addressed             This Visit's Progress    CCM (HYPERLIPIDEMIA) EXPECTED OUTCOME: MONITOR, SELF-MANAGE AND REDUCE SYMPTOMS OF HYPERLIPIDEMIA       Current Barriers:  Knowledge Deficits related to Hyperlipidemia management Chronic Disease Management support and education needs related to Hyperlipidemia, diet, exercise Patient reports she has not been able to exercise as much due to osteoarthritis in her legs, attended outpatient PT and had last session yesterday, states she is not going back as she did not feel like  it helped.  Patient reports she cooks and tries to eat healthy.  Patient reports she has all medications and taking as prescribed.  Planned Interventions: Provider established cholesterol goals reviewed; Counseled on importance of regular laboratory monitoring as prescribed; Provided HLD educational materials; Reviewed role and benefits of statin for ASCVD risk reduction; Reviewed importance of limiting foods high in cholesterol; Reviewed exercise goals and target of 150 minutes per week; Screening for signs and symptoms of depression related to chronic disease state;  Assessed social determinant of health barriers;  Pain assessment completed  Symptom Management: Attend church or other social activities Perform all self care activities independently  Perform IADL's (shopping, preparing meals, housekeeping, managing finances) independently Call provider office for new concerns or questions  - call for medicine refill 2 or 3 days before it runs out - take all medications exactly as prescribed - call doctor with any symptoms you believe are related to your medicine - call doctor when you experience any new symptoms - go to all doctor appointments as scheduled - adhere to prescribed diet: heart healthy - develop an exercise routine Look over education in my chart- heart healthy diet  Follow Up Plan: Telephone follow up appointment with care management team member scheduled for:  08/15/22 at 3 pm       CCM (HYPERTENSION) EXPECTED OUTCOME: SELF-MANAGE AND REDUCE SYMPTOMS OF HYPERTENSION       Current Barriers:  Knowledge Deficits related to Hypertension management Care Coordination needs related to medication management in a patient with Hypertension Chronic Disease Management support and education needs related to Hypertension  No Advanced Directives in place-pt requests documents be mailed Patient reports she lives alone in senior apartment complex, does not drive and states [facility] and [facility]  [employer] provides adequate transportation for all appointments and errands, states her daughter lives in [place] and is nearby and can assist pt if needed. Patient reports she has a wrist cuff and monitors blood pressure " on occasion"  Planned Interventions: Evaluation of current treatment plan related to hypertension self management and patient's adherence to plan as established by provider;   Reviewed prescribed diet low sodium Reviewed medications with patient and discussed importance of compliance;  Counseled on the importance of exercise goals with target of 150 minutes per week Discussed plans with patient for ongoing care management follow up and provided patient with direct contact information for care management team; Advised patient, providing education and rationale, to monitor blood pressure daily and record, calling PCP for findings outside established parameters;  Provided education on prescribed diet low sodium;  Discussed complications of poorly controlled blood pressure such as heart disease, stroke, circulatory complications, vision complications, kidney impairment, sexual dysfunction;  Screening for signs and symptoms of depression related to chronic disease state;  Assessed social determinant of health barriers;  Advanced directives packet mailed  Symptom Management: Take medications as prescribed   Attend all scheduled provider appointments Call pharmacy for medication refills 3-7 days in advance of running out of medications Attend church or other social activities Perform all self care activities independently  Perform IADL's (shopping, preparing meals, housekeeping, managing finances) independently Call provider office for new concerns or questions  check blood pressure weekly choose a place to take my blood pressure (home, clinic or office, retail store) write blood pressure results in a log or diary learn about high blood pressure keep a blood pressure  log take blood pressure log to all doctor appointments call doctor for signs and symptoms of high blood pressure keep all doctor appointments take medications for blood pressure exactly as prescribed report new symptoms to your doctor eat more whole grains, fruits and vegetables, lean meats and healthy fats Look over education in my chart- low sodium diet Advanced directives packet mailed- please complete and have notarized  Follow Up Plan: Telephone follow up appointment with care management team member scheduled for:  08/15/22 at 3 pm            Patient verbalizes understanding of instructions and care plan provided today and agrees to view in MyChart. Active MyChart status and patient understanding of how to access instructions and care plan via MyChart confirmed with patient.  Telephone follow up appointment with care management team member scheduled for:  08/15/22 at 3 pm  Low-Sodium Eating Plan Sodium, which is an element that makes up salt, helps you maintain a healthy balance of fluids in your body. Too much sodium can increase your blood pressure and cause fluid and waste to be held in your body. Your health care provider or dietitian may recommend following this plan if you have high blood pressure (hypertension), kidney disease, liver disease, or heart failure. Eating less sodium can help lower your blood pressure, reduce swelling, and protect your heart, liver, and kidneys. What are tips for following this plan? Reading food labels The Nutrition Facts label lists the amount of sodium in one serving of the food. If you eat more than one serving, you must multiply the listed amount of sodium by the number of servings. Choose foods with less than 140 mg of sodium  per serving. Avoid foods with 300 mg of sodium or more per serving. Shopping  Look for lower-sodium products, often labeled as "low-sodium" or "no salt added." Always check the sodium content, even if foods are  labeled as "unsalted" or "no salt added." Buy fresh foods. Avoid canned foods and pre-made or frozen meals. Avoid canned, cured, or processed meats. Buy breads that have less than 80 mg of sodium per slice. Cooking  Eat more home-cooked food and less restaurant, buffet, and fast food. Avoid adding salt when cooking. Use salt-free seasonings or herbs instead of table salt or sea salt. Check with your health care provider or pharmacist before using salt substitutes. Cook with plant-based oils, such as canola, sunflower, or olive oil. Meal planning When eating at a restaurant, ask that your food be prepared with less salt or no salt, if possible. Avoid dishes labeled as brined, pickled, cured, smoked, or made with soy sauce, miso, or teriyaki sauce. Avoid foods that contain MSG (monosodium glutamate). MSG is sometimes added to [place] food, bouillon, and some canned foods. Make meals that can be grilled, baked, poached, roasted, or steamed. These are generally made with less sodium. General information Most people on this plan should limit their sodium intake to 1,500-2,000 mg (milligrams) of sodium each day. What foods should I eat? Fruits Fresh, frozen, or canned fruit. Fruit juice. Vegetables Fresh or frozen vegetables. "No salt added" canned vegetables. "No salt added" tomato sauce and paste. Low-sodium or reduced-sodium tomato and vegetable juice. Grains Low-sodium cereals, including oats, puffed wheat and rice, and shredded wheat. Low-sodium crackers. Unsalted rice. Unsalted pasta. Low-sodium bread. Whole-grain breads and whole-grain pasta. Meats and other proteins Fresh or frozen (no salt added) meat, poultry, seafood, and fish. Low-sodium canned tuna and salmon. Unsalted nuts. Dried peas, beans, and lentils without added salt. Unsalted canned beans. Eggs. Unsalted nut butters. Dairy Milk. Soy milk. Cheese that is naturally low in sodium, such as ricotta cheese, fresh mozzarella, or  Swiss cheese. Low-sodium or reduced-sodium cheese. Cream cheese. Yogurt. Seasonings and condiments Fresh and dried herbs and spices. Salt-free seasonings. Low-sodium mustard and ketchup. Sodium-free salad dressing. Sodium-free light mayonnaise. Fresh or refrigerated horseradish. Lemon juice. Vinegar. Other foods Homemade, reduced-sodium, or low-sodium soups. Unsalted popcorn and pretzels. Low-salt or salt-free chips. The items listed above may not be a complete list of foods and beverages you can eat. Contact a dietitian for more information. What foods should I avoid? Vegetables Sauerkraut, pickled vegetables, and relishes. Olives. [place] fries. Onion rings. Regular canned vegetables (not low-sodium or reduced-sodium). Regular canned tomato sauce and paste (not low-sodium or reduced-sodium). Regular tomato and vegetable juice (not low-sodium or reduced-sodium). Frozen vegetables in sauces. Grains Instant hot cereals. Bread stuffing, pancake, and biscuit mixes. Croutons. Seasoned rice or pasta mixes. Noodle soup cups. Boxed or frozen macaroni and cheese. Regular salted crackers. Self-rising flour. Meats and other proteins Meat or fish that is salted, canned, smoked, spiced, or pickled. Precooked or cured meat, such as sausages or meat loaves. Tomasa Blase. Ham. Pepperoni. Hot dogs. Corned beef. Chipped beef. Salt pork. Jerky. Pickled herring. Anchovies and sardines. Regular canned tuna. Salted nuts. Dairy Processed cheese and cheese spreads. Hard cheeses. Cheese curds. Blue cheese. Feta cheese. String cheese. Regular cottage cheese. Buttermilk. Canned milk. Fats and oils Salted butter. Regular margarine. Ghee. Bacon fat. Seasonings and condiments Onion salt, garlic salt, seasoned salt, table salt, and sea salt. Canned and packaged gravies. Worcestershire sauce. Tartar sauce. Barbecue sauce. Teriyaki sauce. Soy sauce, including reduced-sodium. Steak  sauce. Fish sauce. Oyster sauce. Cocktail sauce.  Horseradish that you find on the shelf. Regular ketchup and mustard. Meat flavorings and tenderizers. Bouillon cubes. Hot sauce. Pre-made or packaged marinades. Pre-made or packaged taco seasonings. Relishes. Regular salad dressings. Salsa. Other foods Salted popcorn and pretzels. Corn chips and puffs. Potato and tortilla chips. Canned or dried soups. Pizza. Frozen entrees and pot pies. The items listed above may not be a complete list of foods and beverages you should avoid. Contact a dietitian for more information. Summary Eating less sodium can help lower your blood pressure, reduce swelling, and protect your heart, liver, and kidneys. Most people on this plan should limit their sodium intake to 1,500-2,000 mg (milligrams) of sodium each day. Canned, boxed, and frozen foods are high in sodium. Restaurant foods, fast foods, and pizza are also very high in sodium. You also get sodium by adding salt to food. Try to cook at home, eat more fresh fruits and vegetables, and eat less fast food and canned, processed, or prepared foods. This information is not intended to replace advice given to you by your health care provider. Make sure you discuss any questions you have with your health care provider. Document Revised: 12/08/2018 Document Reviewed: 12/03/2018 Elsevier Patient Education  2023 Elsevier Inc. Heart-Healthy Eating Plan Eating a healthy diet is important for the health of your heart. A heart-healthy eating plan includes: Eating less unhealthy fats. Eating more healthy fats. Eating less salt in your food. Salt is also called sodium. Making other changes in your diet. Talk with your doctor or a diet specialist (dietitian) to create an eating plan that is right for you. What is my plan? Your doctor may recommend an eating plan that includes: Total fat: ______% or less of total calories a day. Saturated fat: ______% or less of total calories a day. Cholesterol: less than _________mg a  day. Sodium: less than _________mg a day. What are tips for following this plan? Cooking Avoid frying your food. Try to bake, boil, grill, or broil it instead. You can also reduce fat by: Removing the skin from poultry. Removing all visible fats from meats. Steaming vegetables in water or broth. Meal planning  At meals, divide your plate into four equal parts: Fill one-half of your plate with vegetables and green salads. Fill one-fourth of your plate with whole grains. Fill one-fourth of your plate with lean protein foods. Eat 2-4 cups of vegetables per day. One cup of vegetables is: 1 cup (91 g) broccoli or cauliflower florets. 2 medium carrots. 1 large bell pepper. 1 large sweet potato. 1 large tomato. 1 medium white potato. 2 cups (150 g) raw leafy greens. Eat 1-2 cups of fruit per day. One cup of fruit is: 1 small apple 1 large banana 1 cup (237 g) mixed fruit, 1 large orange,  cup (82 g) dried fruit, 1 cup (240 mL) 100% fruit juice. Eat more foods that have soluble fiber. These are apples, broccoli, carrots, beans, peas, and barley. Try to get 20-30 g of fiber per day. Eat 4-5 servings of nuts, legumes, and seeds per week: 1 serving of dried beans or legumes equals  cup (90 g) cooked. 1 serving of nuts is  oz (12 almonds, 24 pistachios, or 7 walnut halves). 1 serving of seeds equals  oz (8 g). General information Eat more home-cooked food. Eat less restaurant, buffet, and fast food. Limit or avoid alcohol. Limit foods that are high in starch and sugar. Avoid fried foods. Lose weight  if you are overweight. Keep track of how much salt (sodium) you eat. This is important if you have high blood pressure. Ask your doctor to tell you more about this. Try to add vegetarian meals each week. Fats Choose healthy fats. These include olive oil and canola oil, flaxseeds, walnuts, almonds, and seeds. Eat more omega-3 fats. These include salmon, mackerel, sardines, tuna,  flaxseed oil, and ground flaxseeds. Try to eat fish at least 2 times each week. Check food labels. Avoid foods with trans fats or high amounts of saturated fat. Limit saturated fats. These are often found in animal products, such as meats, butter, and cream. These are also found in plant foods, such as palm oil, palm kernel oil, and coconut oil. Avoid foods with partially hydrogenated oils in them. These have trans fats. Examples are stick margarine, some tub margarines, cookies, crackers, and other baked goods. What foods should I eat? Fruits All fresh, canned (in natural juice), or frozen fruits. Vegetables Fresh or frozen vegetables (raw, steamed, roasted, or grilled). Green salads. Grains Most grains. Choose whole wheat and whole grains most of the time. Rice and pasta, including brown rice and pastas made with whole wheat. Meats and other proteins Lean, well-trimmed beef, veal, pork, and lamb. Chicken and [place] without skin. All fish and shellfish. Wild duck, rabbit, pheasant, and venison. Egg whites or low-cholesterol egg substitutes. Dried beans, peas, lentils, and tofu. Seeds and most nuts. Dairy Low-fat or nonfat cheeses, including ricotta and mozzarella. Skim or 1% milk that is liquid, powdered, or evaporated. Buttermilk that is made with low-fat milk. Nonfat or low-fat yogurt. Fats and oils Non-hydrogenated (trans-free) margarines. Vegetable oils, including soybean, sesame, sunflower, olive, peanut, safflower, corn, canola, and cottonseed. Salad dressings or mayonnaise made with a vegetable oil. Beverages Mineral water. Coffee and tea. Diet carbonated beverages. Sweets and desserts Sherbet, gelatin, and fruit ice. Small amounts of dark chocolate. Limit all sweets and desserts. Seasonings and condiments All seasonings and condiments. The items listed above may not be a complete list of foods and drinks you can eat. Contact a dietitian for more options. What foods should I  avoid? Fruits Canned fruit in heavy syrup. Fruit in cream or butter sauce. Fried fruit. Limit coconut. Vegetables Vegetables cooked in cheese, cream, or butter sauce. Fried vegetables. Grains Breads that are made with saturated or trans fats, oils, or whole milk. Croissants. Sweet rolls. Donuts. High-fat crackers, such as cheese crackers. Meats and other proteins Fatty meats, such as hot dogs, ribs, sausage, bacon, rib-eye roast or steak. High-fat deli meats, such as salami and bologna. Caviar. Domestic duck and goose. Organ meats, such as liver. Dairy Cream, sour cream, cream cheese, and creamed cottage cheese. Whole-milk cheeses. Whole or 2% milk that is liquid, evaporated, or condensed. Whole buttermilk. Cream sauce or high-fat cheese sauce. Yogurt that is made from whole milk. Fats and oils Meat fat, or shortening. Cocoa butter, hydrogenated oils, palm oil, coconut oil, palm kernel oil. Solid fats and shortenings, including bacon fat, salt pork, lard, and butter. Nondairy cream substitutes. Salad dressings with cheese or sour cream. Beverages Regular sodas and juice drinks with added sugar. Sweets and desserts Frosting. Pudding. Cookies. Cakes. Pies. Milk chocolate or white chocolate. Buttered syrups. Full-fat ice cream or ice cream drinks. The items listed above may not be a complete list of foods and drinks to avoid. Contact a dietitian for more information. Summary Heart-healthy meal planning includes eating less unhealthy fats, eating more healthy fats, and making other changes  in your diet. Eat a balanced diet. This includes fruits and vegetables, low-fat or nonfat dairy, lean protein, nuts and legumes, whole grains, and heart-healthy oils and fats. This information is not intended to replace advice given to you by your health care provider. Make sure you discuss any questions you have with your health care provider. Document Revised: 02/06/2021 Document Reviewed: 02/06/2021 Elsevier  Patient Education  2023 [employer].

## 2022-06-01 ENCOUNTER — Encounter: Payer: 59 | Admitting: *Deleted

## 2022-06-15 DIAGNOSIS — E785 Hyperlipidemia, unspecified: Secondary | ICD-10-CM | POA: Diagnosis not present

## 2022-06-15 DIAGNOSIS — I10 Essential (primary) hypertension: Secondary | ICD-10-CM

## 2022-06-27 ENCOUNTER — Encounter: Payer: Medicare Other | Admitting: Family Medicine

## 2022-06-29 ENCOUNTER — Ambulatory Visit (INDEPENDENT_AMBULATORY_CARE_PROVIDER_SITE_OTHER): Payer: 59 | Admitting: Pharmacist

## 2022-06-29 DIAGNOSIS — E785 Hyperlipidemia, unspecified: Secondary | ICD-10-CM

## 2022-06-29 MED ORDER — REPATHA SURECLICK 140 MG/ML ~~LOC~~ SOAJ
140.0000 mg | SUBCUTANEOUS | 5 refills | Status: DC
Start: 2022-06-29 — End: 2022-12-12

## 2022-06-29 NOTE — Progress Notes (Signed)
   06/29/2022 Name: Dana Koch MRN: [id number] DOB: 10/26/1955  Hyperlipidemia  Dana Koch is a 67 y.o. year old female who presented for a telephone visit.   They were referred to the pharmacist by their PCP for assistance in managing hyperlipidemia.   Patient is participating in a Managed Medicaid Plan:  Yes  Subjective:  Care Team: Primary Care Provider: Arrie Senate, FNP ; Next Scheduled Visit: 08/2022   Medication Access/Adherence  Current Pharmacy:  [facility] [place], [place] - [address] [place] [address] Phone: [phone] Fax: [phone]   Patient reports affordability concerns with their medications: No  Patient reports access/transportation concerns to their pharmacy: No  Patient reports adherence concerns with their medications:  No     Hyperlipidemia/ASCVD Risk Reduction  Current lipid lowering medications:  Medications tried in the past:   Antiplatelet regimen:   ASCVD History:  n/a Family History: unsure but knows father had "heart issues" Risk Factors: obesity; remote smoking hx quit 20 yrs ago  Current physical activity: n/a  Current medication access support: dual complete   Objective:  Lab Results  Component Value Date   HGBA1C 6.1 (H) 12/26/2021    Lab Results  Component Value Date   CREATININE 0.77 04/24/2022   BUN 12 04/24/2022   NA 140 04/24/2022   K 3.9 04/24/2022   CL 96 04/24/2022   CO2 24 04/24/2022    Lab Results  Component Value Date   CHOL 223 (H) 04/24/2022   HDL 66 04/24/2022   LDLCALC 126 (H) 04/24/2022   TRIG 179 (H) 04/24/2022   CHOLHDL 3.4 04/24/2022    Medications Reviewed Today     Reviewed by Audrie Gallus, RN (Registered Nurse) on 05/29/22 at 1007  Med List Status: <None>   Medication Order Taking? Sig Documenting Provider Last Dose Status Informant  Cyanocobalamin (B-12) 1000 MCG CAPS [id number] Yes Take by mouth. [provider] Taking Active   ezetimibe (ZETIA) 10 MG tablet [id number] Yes Take 1 tablet (10 mg total) by mouth daily. Arrie Senate, FNP Taking Active   hydrochlorothiazide (HYDRODIURIL) 25 MG tablet [id number] Yes Take 1 tablet (25 mg total) by mouth daily. Arrie Senate, FNP Taking Active   levothyroxine (SYNTHROID) 200 MCG tablet [id number] Yes Take 1 tablet (200 mcg total) by mouth daily. Sonny Masters, FNP Taking Active   liothyronine (CYTOMEL) 5 MCG tablet [id number] Yes Take 5 mcg by mouth daily. [provider] Taking Active   Omega-3 Fatty Acids (FISH OIL) 1000 MG CAPS [id number] Yes  [provider] Taking Active               Assessment/Plan:   Hyperlipidemia/ASCVD Risk Reduction: - Currently uncontrolled.  - Reviewed long term complications of uncontrolled cholesterol - Reviewed dietary recommendations including FOLLOWING A HEART HEALTHY DIET/HEALTHY PLATE METHOD - Reviewed lifestyle recommendations including increase exercise as able - Recommend to start repatha 140mg  q 14 days  Continue fish oil OTC  Continue ezetimibe until follow up lipid panel in August (ordered)  Follow Up Plan: PCP in August   Kieth Brightly, PharmD, BCACP Clinical Pharmacist, [facility]

## 2022-07-04 ENCOUNTER — Telehealth: Payer: Self-pay | Admitting: Family Medicine

## 2022-07-06 NOTE — Telephone Encounter (Signed)
Patient wants to talk to Raynelle Fanning about starting Repatha. Don't want to start until she talks to [place].

## 2022-07-10 NOTE — Telephone Encounter (Signed)
Returned call to patient  Patient was able to successfully administer medication at home  Reviewed lifestyle recommendations including increase exercise as able Recommend to continue repatha 140mg  q 14 days             Continue fish oil OTC             Continue ezetimibe until follow up lipid panel in August (ordered)

## 2022-07-15 DIAGNOSIS — E785 Hyperlipidemia, unspecified: Secondary | ICD-10-CM

## 2022-07-16 ENCOUNTER — Encounter: Payer: Self-pay | Admitting: Family Medicine

## 2022-07-24 ENCOUNTER — Ambulatory Visit: Payer: 59 | Admitting: Family Medicine

## 2022-08-15 ENCOUNTER — Ambulatory Visit (INDEPENDENT_AMBULATORY_CARE_PROVIDER_SITE_OTHER): Payer: 59 | Admitting: *Deleted

## 2022-08-15 DIAGNOSIS — I10 Essential (primary) hypertension: Secondary | ICD-10-CM | POA: Diagnosis not present

## 2022-08-15 DIAGNOSIS — E785 Hyperlipidemia, unspecified: Secondary | ICD-10-CM

## 2022-08-15 NOTE — Patient Instructions (Signed)
Please call the care guide team at [phone] if you need to cancel or reschedule your appointment.   If you are experiencing a Mental Health or Behavioral Health Crisis or need someone to talk to, please call the Suicide and Crisis Lifeline: 988 call the [place] National Suicide Prevention Lifeline: 3141866914 or TTY: 650-281-6297 TTY (928)253-2571) to talk to a trained counselor call 1-800-273-TALK (toll free, 24 hour hotline) go to [facility] [address] [place] [phone]) call the [facility]: [phone] call 911   Following is a copy of the CCM Program Consent:  CCM service includes personalized support from designated clinical staff supervised by the physician, including individualized plan of care and coordination with other care providers 24/7 contact phone numbers for assistance for urgent and routine care needs. Service will only be billed when office clinical staff spend 20 minutes or more in a month to coordinate care. Only one practitioner may furnish and bill the service in a calendar month. The patient may stop CCM services at amy time (effective at the end of the month) by phone call to the office staff. The patient will be responsible for cost sharing (co-pay) or up to 20% of the service fee (after annual deductible is met)  Following is a copy of your full provider care plan:   Goals Addressed             This Visit's Progress    COMPLETED: CCM (HYPERLIPIDEMIA) EXPECTED OUTCOME: MONITOR, SELF-MANAGE AND REDUCE SYMPTOMS OF HYPERLIPIDEMIA       Current Barriers:  Knowledge Deficits related to Hyperlipidemia management Chronic Disease Management support and education needs related to Hyperlipidemia, diet, exercise Patient reports she has not been able to exercise as much due to osteoarthritis in her legs, attended outpatient PT and has now finished, states she is not going back as she did not feel like  it helped.  Patient reports she cooks and tries to eat healthy.  Patient reports she has all medications and taking as prescribed.  Planned Interventions: Provider established cholesterol goals reviewed; Counseled on importance of regular laboratory monitoring as prescribed; Reviewed role and benefits of statin for ASCVD risk reduction; Reviewed importance of limiting foods high in cholesterol; Reviewed exercise goals and target of 150 minutes per week; Pain assessment completed Reviewed plan of care including case closure  Symptom Management: Attend church or other social activities Perform all self care activities independently  Perform IADL's (shopping, preparing meals, housekeeping, managing finances) independently Call provider office for new concerns or questions  - call for medicine refill 2 or 3 days before it runs out - take all medications exactly as prescribed - call doctor with any symptoms you believe are related to your medicine - call doctor when you experience any new symptoms - go to all doctor appointments as scheduled - adhere to prescribed diet: heart healthy - develop an exercise routine Follow heart healthy diet Case closure  Follow Up Plan: No further follow up required: Case closure         COMPLETED: CCM (HYPERTENSION) EXPECTED OUTCOME: SELF-MANAGE AND REDUCE SYMPTOMS OF HYPERTENSION       Current Barriers:  Knowledge Deficits related to Hypertension management Care Coordination needs related to medication management in a patient with Hypertension Chronic Disease Management support and education needs related to Hypertension No Advanced Directives in place- documents previously mailed Patient reports she lives alone in senior apartment complex, does not drive and states [employer] provides adequate transportation for all appointments and errands, states her daughter lives in [place] and is nearby and can assist pt if needed. Patient reports  she has a wrist cuff and monitors blood pressure " on occasion", blood pressure reading today 138/73, no new concerns reported, pt states she is doing well  Planned Interventions: Evaluation of current treatment plan related to hypertension self management and patient's adherence to plan as established by provider;   Reviewed medications with patient and discussed importance of compliance;  Counseled on the importance of exercise goals with target of 150 minutes per week Discussed plans with patient for ongoing care management follow up and provided patient with direct contact information for care management team; Advised patient, providing education and rationale, to monitor blood pressure daily and record, calling PCP for findings outside established parameters;  Discussed complications of poorly controlled blood pressure such as heart disease, stroke, circulatory complications, vision complications, kidney impairment, sexual dysfunction;  Reinforced low sodium diet Reviewed upcoming scheduled appointments  Symptom Management: Take medications as prescribed   Attend all scheduled provider appointments Call pharmacy for medication refills 3-7 days in advance of running out of medications Attend church or other social activities Perform all self care activities independently  Perform IADL's (shopping, preparing meals, housekeeping, managing finances) independently Call provider office for new concerns or questions  check blood pressure weekly choose a place to take my blood pressure (home, clinic or office, retail store) write blood pressure results in a log or diary learn about high blood pressure keep a blood pressure log take blood pressure log to all doctor appointments call doctor for signs and symptoms of high blood pressure keep all doctor appointments take medications for blood pressure exactly as prescribed report new symptoms to your doctor eat more whole grains, fruits and  vegetables, lean meats and healthy fats Follow low sodium diet- read labels for sodium content Limit/ avoid fast food Case closure  Follow Up Plan: No further follow up required: Case closure               Patient verbalizes understanding of instructions and care plan provided today and agrees to view in MyChart. Active MyChart status and patient understanding of how to access instructions and care plan via MyChart confirmed with patient.  No further follow up required: case closure

## 2022-08-15 NOTE — Chronic Care Management (AMB) (Signed)
Chronic Care Management   CCM RN Visit Note  08/15/2022 Name: Dana Koch MRN: [id number] DOB: 11-25-1955  Subjective: Dana Koch is a 67 y.o. year old female who is a primary care patient of Ellamae Sia, Aleen Campi, FNP. The patient was referred to the Chronic Care Management team for assistance with care management needs subsequent to provider initiation of CCM services and plan of care.    Today's Visit:  Engaged with patient by telephone for follow up visit.        Goals Addressed             This Visit's Progress    COMPLETED: CCM (HYPERLIPIDEMIA) EXPECTED OUTCOME: MONITOR, SELF-MANAGE AND REDUCE SYMPTOMS OF HYPERLIPIDEMIA       Current Barriers:  Knowledge Deficits related to Hyperlipidemia management Chronic Disease Management support and education needs related to Hyperlipidemia, diet, exercise Patient reports she has not been able to exercise as much due to osteoarthritis in her legs, attended outpatient PT and has now finished, states she is not going back as she did not feel like it helped.  Patient reports she cooks and tries to eat healthy.  Patient reports she has all medications and taking as prescribed.  Planned Interventions: Provider established cholesterol goals reviewed; Counseled on importance of regular laboratory monitoring as prescribed; Reviewed role and benefits of statin for ASCVD risk reduction; Reviewed importance of limiting foods high in cholesterol; Reviewed exercise goals and target of 150 minutes per week; Pain assessment completed Reviewed plan of care including case closure  Symptom Management: Attend church or other social activities Perform all self care activities independently  Perform IADL's (shopping, preparing meals, housekeeping, managing finances) independently Call provider office for new concerns or questions  - call for medicine refill 2 or 3 days before it runs out - take all medications exactly as prescribed - call  doctor with any symptoms you believe are related to your medicine - call doctor when you experience any new symptoms - go to all doctor appointments as scheduled - adhere to prescribed diet: heart healthy - develop an exercise routine Follow heart healthy diet Case closure  Follow Up Plan: No further follow up required: Case closure         COMPLETED: CCM (HYPERTENSION) EXPECTED OUTCOME: SELF-MANAGE AND REDUCE SYMPTOMS OF HYPERTENSION       Current Barriers:  Knowledge Deficits related to Hypertension management Care Coordination needs related to medication management in a patient with Hypertension Chronic Disease Management support and education needs related to Hypertension No Advanced Directives in place- documents previously mailed Patient reports she lives alone in senior apartment complex, does not drive and states [employer] provides adequate transportation for all appointments and errands, states her daughter lives in [place] and is nearby and can assist pt if needed. Patient reports she has a wrist cuff and monitors blood pressure " on occasion", blood pressure reading today 138/73, no new concerns reported, pt states she is doing well  Planned Interventions: Evaluation of current treatment plan related to hypertension self management and patient's adherence to plan as established by provider;   Reviewed medications with patient and discussed importance of compliance;  Counseled on the importance of exercise goals with target of 150 minutes per week Discussed plans with patient for ongoing care management follow up and provided patient with direct contact information for care management team; Advised patient, providing education and rationale, to monitor blood pressure daily and record, calling PCP for findings outside established parameters;  Discussed complications of poorly controlled blood pressure such as heart disease, stroke, circulatory complications, vision  complications, kidney impairment, sexual dysfunction;  Reinforced low sodium diet Reviewed upcoming scheduled appointments  Symptom Management: Take medications as prescribed   Attend all scheduled provider appointments Call pharmacy for medication refills 3-7 days in advance of running out of medications Attend church or other social activities Perform all self care activities independently  Perform IADL's (shopping, preparing meals, housekeeping, managing finances) independently Call provider office for new concerns or questions  check blood pressure weekly choose a place to take my blood pressure (home, clinic or office, retail store) write blood pressure results in a log or diary learn about high blood pressure keep a blood pressure log take blood pressure log to all doctor appointments call doctor for signs and symptoms of high blood pressure keep all doctor appointments take medications for blood pressure exactly as prescribed report new symptoms to your doctor eat more whole grains, fruits and vegetables, lean meats and healthy fats Follow low sodium diet- read labels for sodium content Limit/ avoid fast food Case closure  Follow Up Plan: No further follow up required: Case closure               Plan:No further follow up required: Case closure  Irving Shows [facility], BSN RN Case Manager [facility] [phone]

## 2022-08-21 ENCOUNTER — Ambulatory Visit: Payer: 59 | Admitting: Family Medicine

## 2022-08-28 ENCOUNTER — Ambulatory Visit: Payer: 59 | Admitting: Family Medicine

## 2022-09-04 ENCOUNTER — Ambulatory Visit (INDEPENDENT_AMBULATORY_CARE_PROVIDER_SITE_OTHER): Payer: 59 | Admitting: Family Medicine

## 2022-09-04 ENCOUNTER — Encounter: Payer: Self-pay | Admitting: Family Medicine

## 2022-09-04 VITALS — BP 115/72 | HR 73 | Ht 75.0 in | Wt 350.0 lb

## 2022-09-04 DIAGNOSIS — E039 Hypothyroidism, unspecified: Secondary | ICD-10-CM

## 2022-09-04 DIAGNOSIS — Z87891 Personal history of nicotine dependence: Secondary | ICD-10-CM | POA: Diagnosis not present

## 2022-09-04 DIAGNOSIS — M25561 Pain in right knee: Secondary | ICD-10-CM | POA: Diagnosis not present

## 2022-09-04 DIAGNOSIS — E785 Hyperlipidemia, unspecified: Secondary | ICD-10-CM | POA: Diagnosis not present

## 2022-09-04 DIAGNOSIS — I10 Essential (primary) hypertension: Secondary | ICD-10-CM

## 2022-09-04 DIAGNOSIS — R7303 Prediabetes: Secondary | ICD-10-CM | POA: Diagnosis not present

## 2022-09-04 LAB — LIPID PANEL
Chol/HDL Ratio: 2.4 ratio (ref 0.0–4.4)
Cholesterol, Total: 147 mg/dL (ref 100–199)
HDL: 61 mg/dL (ref >39)
LDL Chol Calc (NIH): 57 mg/dL (ref 0–99)
Triglycerides: 179 mg/dL — ABNORMAL HIGH (ref 0–149)
VLDL Cholesterol Cal: 29 mg/dL (ref 5–40)

## 2022-09-04 MED ORDER — EZETIMIBE 10 MG PO TABS
10.0000 mg | ORAL_TABLET | Freq: Every day | ORAL | 0 refills | Status: DC
Start: 2022-09-04 — End: 2023-03-14

## 2022-09-04 MED ORDER — HYDROCHLOROTHIAZIDE 25 MG PO TABS: 25.0000 mg | ORAL_TABLET | Freq: Every day | ORAL | 0 refills | Status: DC

## 2022-09-04 NOTE — Progress Notes (Signed)
Acute Office Visit  Subjective:  Patient ID: Dana Koch, female    DOB: [birth year], 67 y.o.   MRN: [id number]  Chief Complaint  Patient presents with   Medical Management of Chronic Issues   Hyperlipidemia   Hypertension   Patient is in today for follow up of chronic conditions   Hypertension Has BP monitor at home Yes BP at home average 107/56 ROS Denies anxiety, fatigue, peripheral edema, changes to vision, chest pain, headaches, palpitations, sweats, SOB, PND, orthopnea Meds hydrochlorothiazide CAD risks hypertension, hypercholesterolemia/hyperlipidemia  Hyperlipidemia: Patient presents with hyperlipidemia.  Symptoms negative. There is a family history of hyperlipidemia. There is a family history of early ischemia heart disease, sister 39, unknown cause.  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 States that her thigh is where the pain is. She is taking two 500 mg tylenol and 3 ibuprofen 3 times daily per [facility]. She is not still following with [facility]. She completed 6 weeks of PT. States that she only used the stationary bike and it did not improve her symptoms. She has been using TENS unit at home and that is helping her some. 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   Former Smoker  Smoked 4 PPD  Quit 20 years ago   Obesity  Patient expresses frustration over not being able to walk and exercise due to pain in her knee. She is trying to improve her diet some, but admits that she "eats like a 67 year old"   ROS As per HPI  Objective:  BP 115/72   Pulse 73   Ht 6\' 3"  (1.905 m)   Wt 250 lb (113.4 kg) Comment: Pt refused to weigh on office scale. Wt provided is from her home scale.  SpO2 95%   BMI 31.25 kg/m   Physical Exam Constitutional:      General: She is awake. She is not in acute distress.    Appearance: Normal appearance. She is well-groomed. She is morbidly obese. She is not  ill-appearing, toxic-appearing or diaphoretic.     Interventions: She is not intubated. Cardiovascular:     Rate and Rhythm: Normal rate and regular rhythm.     Pulses:          Radial pulses are 2+ on the right side and 2+ on the left side.     Heart sounds: Normal heart sounds.  Pulmonary:     Effort: Pulmonary effort is normal. No tachypnea, bradypnea, accessory muscle usage, prolonged expiration, respiratory distress or retractions. She is not intubated.     Breath sounds: Normal breath sounds. No stridor, decreased air movement or transmitted upper airway sounds. No decreased breath sounds, wheezing, rhonchi or rales.  Musculoskeletal:     Left lower leg: Edema present.     Comments: Limping, walks with cane  Skin:    General: Skin is warm.     Capillary Refill: Capillary refill takes less than 2 seconds.     Comments: Left lower extremity with erythematous, hyperpigmentation   Neurological:     Mental Status: She is alert and easily aroused.  Psychiatric:        Attention and Perception: Attention and perception normal. She is attentive. She does not perceive visual hallucinations.        Mood and Affect: Mood is depressed. Mood is not anxious or elated. Affect is tearful. Affect is not blunt, angry or inappropriate.  Speech: Speech normal. She is communicative. Speech is not rapid and pressured or slurred.        Behavior: Behavior is cooperative.        Thought Content: Thought content normal. Thought content does not include homicidal or suicidal ideation. Thought content does not include homicidal or suicidal plan.        Cognition and Memory: Cognition and memory normal.        Judgment: Judgment normal.        09/04/2022   10:02 AM 05/29/2022    9:51 AM 05/18/2022    9:26 AM  Depression screen PHQ 2/9  Decreased Interest 0 0 0  Down, Depressed, Hopeless 0 0 0  PHQ - 2 Score 0 0 0  Altered sleeping 0  0  Tired, decreased energy 1  1  Change in appetite 1  1   Feeling bad or failure about yourself  0  0  Trouble concentrating 0  0  Moving slowly or fidgety/restless 0  0  Suicidal thoughts 0  0  PHQ-9 Score 2  2  Difficult doing work/chores Not difficult at all  Not difficult at all      09/04/2022   10:02 AM 05/18/2022    9:26 AM 04/24/2022    8:09 AM 12/26/2021    8:40 AM  GAD 7 : Generalized Anxiety Score  Nervous, Anxious, on Edge 0 0 0 0  Control/stop worrying 0 0 0 0  Worry too much - different things 0 0 0 0  Trouble relaxing 2 2 0 0  Restless 0 0 0 0  Easily annoyed or irritable 2 2 0 0  Afraid - awful might happen 0 0 0 0  Total GAD 7 Score 4 4 0 0  Anxiety Difficulty Not difficult at all Not difficult at all  Not difficult at all   The 10-year ASCVD risk score (Arnett DK, et al., 2019) is: 11.2%   Values used to calculate the score:     Age: 76 years     Sex: Female     Is Non-Hispanic African American: No     Diabetic: Yes     Tobacco smoker: No     Systolic Blood Pressure: 115 mmHg     Is BP treated: Yes     HDL Cholesterol: 66 mg/dL     Total Cholesterol: 223 mg/dL  Assessment & Plan:  1. Dyslipidemia, goal LDL below 100 Labs as below. Will communicate results to patient once available. Will await results to determine next steps.  Not fasting  Refill as below  Reviewed previous ASCVD risk score as above. Patient warrants pharmacologic therapy.  - Lipid panel - ezetimibe (ZETIA) 10 MG tablet; Take 1 tablet (10 mg total) by mouth daily.  Dispense: 90 tablet; Refill: 0  2. Primary hypertension Well controlled. Continue current regimen.  - hydrochlorothiazide (HYDRODIURIL) 25 MG tablet; Take 1 tablet (25 mg total) by mouth daily.  Dispense: 90 tablet; Refill: 0  3. Morbid obesity (HCC) Encouraged patient to continue to follow up with ortho and PT to improve her knee pain and physical activity. Patient declined referral to nutrition at this time.   4. Former smoker Patient quit smoking >15 years ago. Does not  warrant screening. Praised patient for quitting smoking.   5. Acute pain of right knee Patient declined additional referral for PT and orthopedics.  Patient declined steroid burst and injection at this time.  Encouraged patient to use Voltaren gel and TENS  unit.  Encouraged patient to follow up if symptoms persist and she would like further evaluation.   6. Prediabetes Reviewed lab work that patient brought in from [facility]. Recent A1c 5.8%. Patient to follow up with Endocrinology to determine next steps for management.   The above assessment and management plan was discussed with the patient. The patient verbalized understanding of and has agreed to the management plan using shared-decision making. Patient is aware to call the clinic if they develop any new symptoms or if symptoms fail to improve or worsen. Patient is aware when to return to the clinic for a follow-up visit. Patient educated on when it is appropriate to go to the emergency department.   Return in about 6 months (around 03/07/2023) for Chronic Condition Follow up.  Neale Burly, DNP-FNP [facility] [address] [place], [place] [address] [phone]

## 2022-09-05 NOTE — Progress Notes (Signed)
Overall cholesterol values improved. ASCVD risk remains in the intermediate category. Recommend patient continue current treatment. Increase intake of fresh fruits and vegetables, increase intake of lean proteins. Bake, broil, or grill foods. Avoid fried, greasy, and fatty foods. Avoid fast foods. Increase intake of fiber-rich whole grains. Exercise encouraged - at least 150 minutes per week and advance as tolerated. Can also try fish oil and we will recheck in 3 months.   The 10-year ASCVD risk score (Arnett DK, et al., 2019) is: 9.3%   Values used to calculate the score:     Age: 67 years     Sex: Female     Is Non-Hispanic African American: No     Diabetic: Yes     Tobacco smoker: No     Systolic Blood Pressure: 115 mmHg     Is BP treated: Yes     HDL Cholesterol: 61 mg/dL     Total Cholesterol: 147 mg/dL

## 2022-09-18 ENCOUNTER — Encounter: Payer: 59 | Admitting: Family Medicine

## 2022-09-18 NOTE — Progress Notes (Signed)
No retinopathy. Repeat due 06/2023

## 2022-10-24 ENCOUNTER — Ambulatory Visit (INDEPENDENT_AMBULATORY_CARE_PROVIDER_SITE_OTHER): Payer: 59 | Admitting: Family Medicine

## 2022-10-24 ENCOUNTER — Encounter: Payer: Self-pay | Admitting: Family Medicine

## 2022-10-24 VITALS — BP 117/73 | HR 86 | Temp 98.0°F | Ht 75.0 in | Wt 347.0 lb

## 2022-10-24 DIAGNOSIS — Z0000 Encounter for general adult medical examination without abnormal findings: Secondary | ICD-10-CM

## 2022-10-24 DIAGNOSIS — R413 Other amnesia: Secondary | ICD-10-CM

## 2022-10-24 DIAGNOSIS — F419 Anxiety disorder, unspecified: Secondary | ICD-10-CM

## 2022-10-24 DIAGNOSIS — F339 Major depressive disorder, recurrent, unspecified: Secondary | ICD-10-CM

## 2022-10-24 DIAGNOSIS — Z0001 Encounter for general adult medical examination with abnormal findings: Secondary | ICD-10-CM

## 2022-10-24 DIAGNOSIS — Z23 Encounter for immunization: Secondary | ICD-10-CM | POA: Diagnosis not present

## 2022-10-24 DIAGNOSIS — G629 Polyneuropathy, unspecified: Secondary | ICD-10-CM | POA: Insufficient documentation

## 2022-10-24 NOTE — Patient Instructions (Signed)
Prevagen
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
## 2022-11-20 ENCOUNTER — Institutional Professional Consult (permissible substitution): Payer: 59 | Admitting: Professional Counselor

## 2022-11-20 DIAGNOSIS — H5712 Ocular pain, left eye: Secondary | ICD-10-CM | POA: Diagnosis not present

## 2022-11-20 DIAGNOSIS — H40022 Open angle with borderline findings, high risk, left eye: Secondary | ICD-10-CM | POA: Diagnosis not present

## 2022-11-21 ENCOUNTER — Ambulatory Visit: Payer: 59 | Admitting: Family Medicine

## 2022-11-22 ENCOUNTER — Ambulatory Visit (INDEPENDENT_AMBULATORY_CARE_PROVIDER_SITE_OTHER): Payer: 59 | Admitting: Family Medicine

## 2022-11-22 ENCOUNTER — Encounter: Payer: Self-pay | Admitting: Family Medicine

## 2022-11-22 VITALS — BP 127/79 | HR 81 | Temp 98.3°F | Ht 75.0 in | Wt 350.0 lb

## 2022-11-22 DIAGNOSIS — R519 Headache, unspecified: Secondary | ICD-10-CM | POA: Diagnosis not present

## 2022-11-22 DIAGNOSIS — R6889 Other general symptoms and signs: Secondary | ICD-10-CM | POA: Diagnosis not present

## 2022-11-22 LAB — URINALYSIS, ROUTINE W REFLEX MICROSCOPIC
Bilirubin, UA: NEGATIVE
Glucose, UA: NEGATIVE
Leukocytes,UA: NEGATIVE
Nitrite, UA: NEGATIVE
Protein,UA: NEGATIVE
Specific Gravity, UA: 1.025 (ref 1.005–1.030)
Urobilinogen, Ur: 0.2 mg/dL (ref 0.2–1.0)
pH, UA: 5.5 (ref 5.0–7.5)

## 2022-11-22 LAB — MICROSCOPIC EXAMINATION
RBC, Urine: NONE SEEN /[HPF] (ref 0–2)
Renal Epithel, UA: NONE SEEN /[HPF]
Yeast, UA: NONE SEEN

## 2022-11-22 MED ORDER — PREDNISONE 20 MG PO TABS
40.0000 mg | ORAL_TABLET | Freq: Every day | ORAL | 0 refills | Status: AC
Start: 2022-11-22 — End: 2022-11-27

## 2022-11-22 NOTE — Progress Notes (Signed)
Subjective:  Patient ID: Dana Koch, female    DOB: 08-Sep-1955, 67 y.o.   MRN: [id number]  Patient Care Team: Arrie Senate, FNP as PCP - General (Family Medicine)   Chief Complaint:  left side facial pain [place]e [place], orbital)   HPI: Dana Koch is a 66 y.o. female presenting on 11/22/2022 for left side facial pain ([place], orbital) Micah Flesher to eye doctor because she cannot touch her temple or eye without "severe pain"  Seen by eye doctor on Tuesday and instructed to follow up with PCP Started 1.5 weeks ago and is not going away.  Denies fever. States that she has a headache today due to stress and worry. Has not noticed it earlier this week. Denies changes to vision. Denies claudication of jaw. She does have a history of decreased perfusion/neuropathy in her bilateral lower extremities   Relevant past medical, surgical, family, and social history reviewed and updated as indicated.  Allergies and medications reviewed and updated. Data reviewed: Chart in Epic.   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   Neuropathy    Prediabetes    Venous stasis     Past Surgical History:  Procedure Laterality Date   CHOLECYSTECTOMY  01/16/1987   LEG SURGERY Left    broken left leg   TOTAL ABDOMINAL HYSTERECTOMY  2020    Social History   Socioeconomic History   Marital status: Legally Separated    Spouse name: Not on file   Number of children: Not on file   Years of education: Not on file   Highest education level: 12th grade  Occupational History   Not on file  Tobacco Use   Smoking status: Former    Current packs/day: 0.00     Average packs/day: 4.0 packs/day for 40.0 years (159.9 ttl pk-yrs)    Types: Cigarettes    Start date: 12/04/1972    Quit date: 01/04/2003    Years since quitting: 19.8   Smokeless tobacco: Never  Vaping Use   Vaping status: Never Used  Substance and Sexual Activity   Alcohol use: No    Alcohol/week: 0.0 standard drinks of alcohol   Drug use: No   Sexual activity: Not Currently    Birth control/protection: Post-menopausal  Other Topics Concern   Not on file  Social History Narrative   Not on file   Social Determinants of Health   Financial Resource Strain: Low Risk  (10/24/2022)   Overall Financial Resource Strain (CARDIA)    Difficulty of Paying Living Expenses: Not very hard  Food Insecurity: No Food Insecurity (10/24/2022)   Hunger Vital Sign    Worried About Running Out of Food in the Last Year: Never true    Ran Out of Food in the Last Year: Never true  Transportation Needs: No Transportation Needs (10/24/2022)   PRAPARE - Administrator, Civil Service (Medical): No    Lack of Transportation (Non-Medical): No  Physical Activity: Inactive (10/24/2022)   Exercise Vital Sign    Days of Exercise per Week: 0 days    Minutes of Exercise per Session: 0 min  Stress: No  Stress Concern Present (10/24/2022)   [employer] of Occupational Health - Occupational Stress Questionnaire    Feeling of Stress : Only a little  Social Connections: Socially Isolated (10/24/2022)   Social Connection and Isolation Panel [NHANES]    Frequency of Communication with Friends and Family: Once a week    Frequency of Social Gatherings with Friends and Family: Once a week    Attends Religious Services: Never    Database administrator or Organizations: No    Attends Banker Meetings: Never    Marital Status: Separated  Intimate Partner Violence: Not At Risk (10/24/2022)   Humiliation, Afraid, Rape, and Kick questionnaire    Fear of Current or Ex-Partner: No    Emotionally  Abused: No    Physically Abused: No    Sexually Abused: No    Outpatient Encounter Medications as of 11/22/2022  Medication Sig   acetaminophen (TYLENOL) 500 MG tablet Take 1,000 mg by mouth every 8 (eight) hours as needed for moderate pain.   Cyanocobalamin (B-12) 1000 MCG CAPS Take by mouth.   Evolocumab (REPATHA SURECLICK) 140 MG/ML SOAJ Inject 140 mg into the skin every 14 (fourteen) days.   ezetimibe (ZETIA) 10 MG tablet Take 1 tablet (10 mg total) by mouth daily.   hydrochlorothiazide (HYDRODIURIL) 25 MG tablet Take 1 tablet (25 mg total) by mouth daily.   ibuprofen (ADVIL) 200 MG tablet Take 200 mg by mouth every 8 (eight) hours as needed for moderate pain.   levothyroxine (SYNTHROID) 200 MCG tablet Take 1 tablet (200 mcg total) by mouth daily.   liothyronine (CYTOMEL) 5 MCG tablet Take 5 mcg by mouth daily.   No facility-administered encounter medications on file as of 11/22/2022.    Allergies  Allergen Reactions   Meperidine Other (See Comments)    Stops my heart   Statins Other (See Comments) and Shortness Of Breath    sob   Amoxicillin Hives   Red Yeast Rice [Cholestin] Itching    Review of Systems As per HPI  Objective:  BP 127/79   Pulse 81   Temp 98.3 F (36.8 C)   Ht 6\' 3"  (1.905 m)   Wt (!) 350 lb (158.8 kg)   SpO2 95%   BMI 43.75 kg/m    Wt Readings from Last 3 Encounters:  11/22/22 (!) 350 lb (158.8 kg)  10/24/22 (!) 347 lb (157.4 kg)  09/04/22 (!) 350 lb (158.8 kg)   Physical Exam Constitutional:      General: She is awake. She is not in acute distress.    Appearance: Normal appearance. She is well-developed and well-groomed. She is morbidly obese. She is not ill-appearing, toxic-appearing or diaphoretic.  Cardiovascular:     Rate and Rhythm: Normal rate and regular rhythm.     Pulses: Normal pulses.          Radial pulses are 2+ on the right side and 2+ on the left side.       Posterior tibial pulses are 2+ on the right side and 2+ on the left  side.     Heart sounds: Normal heart sounds. No murmur heard.    No gallop.     Comments: Hyperpigmentation in bilateral lower extremities  Pulmonary:     Effort: Pulmonary effort is normal. No respiratory distress.     Breath sounds: Normal breath sounds. No stridor. No wheezing, rhonchi or rales.  Musculoskeletal:     Cervical back: Full passive range of motion without pain and  neck supple.     Right lower leg: No edema.     Left lower leg: No edema.  Skin:    General: Skin is warm.     Capillary Refill: Capillary refill takes less than 2 seconds.  Neurological:     General: No focal deficit present.     Mental Status: She is alert, oriented to person, place, and time and easily aroused. Mental status is at baseline.     GCS: GCS eye subscore is 4. GCS verbal subscore is 5. GCS motor subscore is 6.     Motor: No weakness.  Psychiatric:        Attention and Perception: Attention and perception normal.        Mood and Affect: Mood and affect normal.        Speech: Speech normal.        Behavior: Behavior normal. Behavior is cooperative.        Thought Content: Thought content normal. Thought content does not include homicidal or suicidal ideation. Thought content does not include homicidal or suicidal plan.        Cognition and Memory: Cognition and memory normal.        Judgment: Judgment normal.     Results for orders placed or performed in visit on 09/18/22  HM DIABETES EYE EXAM  Result Value Ref Range   HM Diabetic Eye Exam No Retinopathy No Retinopathy       11/22/2022   12:16 PM 10/24/2022    9:57 AM 09/04/2022   10:02 AM 05/29/2022    9:51 AM 05/18/2022    9:26 AM  Depression screen PHQ 2/9  Decreased Interest 0 1 0 0 0  Down, Depressed, Hopeless 0 0 0 0 0  PHQ - 2 Score 0 1 0 0 0  Altered sleeping 0 0 0  0  Tired, decreased energy 0 1 1  1   Change in appetite 1 2 1  1   Feeling bad or failure about yourself  0 0 0  0  Trouble concentrating 0 0 0  0  Moving slowly  or fidgety/restless 0 0 0  0  Suicidal thoughts 0 0 0  0  PHQ-9 Score 1 4 2  2   Difficult doing work/chores Somewhat difficult Somewhat difficult Not difficult at all  Not difficult at all       11/22/2022   12:17 PM 10/24/2022   10:09 AM 09/04/2022   10:02 AM 05/18/2022    9:26 AM  GAD 7 : Generalized Anxiety Score  Nervous, Anxious, on Edge 2 1 0 0  Control/stop worrying 0 0 0 0  Worry too much - different things 0 0 0 0  Trouble relaxing 0 2 2 2   Restless 0 3 0 0  Easily annoyed or irritable 0 0 2 2  Afraid - awful might happen 0 0 0 0  Total GAD 7 Score 2 6 4 4   Anxiety Difficulty Not difficult at all  Not difficult at all Not difficult at all   Pertinent labs & imaging results that were available during my care of the patient were reviewed by me and considered in my medical decision making.  Assessment & Plan:  Joycelin was seen today for left side facial pain.  Diagnoses and all orders for this visit:  Temporal pain Labs as below. Will communicate results to patient once available. Will await results to determine next steps. Once results are back, can consider [place].  Will start prednisone as  below until results are in.  -     predniSONE (DELTASONE) 20 MG tablet; Take 2 tablets (40 mg total) by mouth daily with breakfast for 5 days. -     CBC with Differential/Platelet -     CMP14+EGFR -     VITAMIN D 25 Hydroxy (Vit-D Deficiency, Fractures) -     Sedimentation Rate -     C-reactive protein -     Urinalysis, Routine w reflex microscopic -     Phosphorus   Continue all other maintenance medications.  Follow up plan: Return if symptoms worsen or fail to improve.  Continue healthy lifestyle choices, including diet (rich in fruits, vegetables, and lean proteins, and low in salt and simple carbohydrates) and exercise (at least 30 minutes of moderate physical activity daily).  Written and verbal instructions provided   The above assessment and management plan was discussed  with the patient. The patient verbalized understanding of and has agreed to the management plan. Patient is aware to call the clinic if they develop any new symptoms or if symptoms persist or worsen. Patient is aware when to return to the clinic for a follow-up visit. Patient educated on when it is appropriate to go to the emergency department.   Neale Burly, DNP-FNP [facility] [address] [place], [place] [address] [phone]

## 2022-11-22 NOTE — Patient Instructions (Signed)
Iliopsoas stretching videos on YouTube

## 2022-11-23 LAB — CMP14+EGFR
ALT: 16 [IU]/L (ref 0–32)
AST: 17 [IU]/L (ref 0–40)
Albumin: 4.6 g/dL (ref 3.9–4.9)
Alkaline Phosphatase: 97 [IU]/L (ref 44–121)
BUN/Creatinine Ratio: 18 (ref 12–28)
BUN: 13 mg/dL (ref 8–27)
Bilirubin Total: 0.3 mg/dL (ref 0.0–1.2)
CO2: 25 mmol/L (ref 20–29)
Calcium: 9.9 mg/dL (ref 8.7–10.3)
Chloride: 99 mmol/L (ref 96–106)
Creatinine, Ser: 0.73 mg/dL (ref 0.57–1.00)
Globulin, Total: 3.1 g/dL (ref 1.5–4.5)
Glucose: 105 mg/dL — ABNORMAL HIGH (ref 70–99)
Potassium: 4.1 mmol/L (ref 3.5–5.2)
Sodium: 141 mmol/L (ref 134–144)
Total Protein: 7.7 g/dL (ref 6.0–8.5)
eGFR: 91 mL/min/{1.73_m2} (ref >59)

## 2022-11-23 LAB — CBC WITH DIFFERENTIAL/PLATELET
Basophils Absolute: 0.1 10*3/uL (ref 0.0–0.2)
Basos: 1 %
EOS (ABSOLUTE): 0.2 10*3/uL (ref 0.0–0.4)
Eos: 3 %
Hematocrit: 38.5 % (ref 34.0–46.6)
Hemoglobin: 12.2 g/dL (ref 11.1–15.9)
Immature Grans (Abs): 0 10*3/uL (ref 0.0–0.1)
Immature Granulocytes: 1 %
Lymphocytes Absolute: 2.2 10*3/uL (ref 0.7–3.1)
Lymphs: 32 %
MCH: 29 pg (ref 26.6–33.0)
MCHC: 31.7 g/dL (ref 31.5–35.7)
MCV: 92 fL (ref 79–97)
Monocytes Absolute: 0.4 10*3/uL (ref 0.1–0.9)
Monocytes: 6 %
Neutrophils Absolute: 3.9 10*3/uL (ref 1.4–7.0)
Neutrophils: 57 %
Platelets: 253 10*3/uL (ref 150–450)
RBC: 4.2 x10E6/uL (ref 3.77–5.28)
RDW: 12.9 % (ref 11.7–15.4)
WBC: 6.8 10*3/uL (ref 3.4–10.8)

## 2022-11-23 LAB — VITAMIN D 25 HYDROXY (VIT D DEFICIENCY, FRACTURES): Vit D, 25-Hydroxy: 7.9 ng/mL — ABNORMAL LOW (ref 30.0–100.0)

## 2022-11-23 LAB — SEDIMENTATION RATE: Sed Rate: 32 mm/h (ref 0–40)

## 2022-11-23 LAB — C-REACTIVE PROTEIN: CRP: 2 mg/L (ref 0–10)

## 2022-11-23 LAB — PHOSPHORUS: Phosphorus: 3.3 mg/dL (ref 3.0–4.3)

## 2022-11-27 ENCOUNTER — Encounter: Payer: Self-pay | Admitting: Family Medicine

## 2022-11-27 MED ORDER — VITAMIN D (ERGOCALCIFEROL) 1.25 MG (50000 UNIT) PO CAPS: 50000.0000 [IU] | ORAL_CAPSULE | ORAL | 0 refills | Status: AC

## 2022-11-27 NOTE — Progress Notes (Signed)
Vitamin d sent

## 2022-11-27 NOTE — Addendum Note (Signed)
Addended by: Neale Burly on: 11/27/2022 12:18 PM   Modules accepted: Orders

## 2022-11-27 NOTE — Progress Notes (Signed)
Vitamin D level is low. I have sent in a weekly supplement to take for the next 12 weeks. After that, take a daily OTC vitamin D supplement with 1000-2000 IU. All other labs within normal limits. Is patient still having symptoms?

## 2022-12-07 ENCOUNTER — Encounter: Payer: Self-pay | Admitting: Family Medicine

## 2022-12-12 ENCOUNTER — Other Ambulatory Visit: Payer: Self-pay | Admitting: Family Medicine

## 2022-12-12 DIAGNOSIS — E785 Hyperlipidemia, unspecified: Secondary | ICD-10-CM

## 2022-12-19 ENCOUNTER — Other Ambulatory Visit: Payer: Self-pay | Admitting: Family Medicine

## 2022-12-19 DIAGNOSIS — E039 Hypothyroidism, unspecified: Secondary | ICD-10-CM

## 2023-01-18 ENCOUNTER — Telehealth: Payer: Self-pay | Admitting: Family Medicine

## 2023-01-18 NOTE — Telephone Encounter (Signed)
 Patient aware and verbalized understanding.

## 2023-01-18 NOTE — Telephone Encounter (Signed)
 Copied from CRM [id number]. Topic: Clinical - Prescription Issue >> Jan 18, 2023 11:11 AM Adelina Mings wrote: Reason for CRM: insurance denied REPATHA SURECLICK 140 MG/ML SOAJ

## 2023-01-22 ENCOUNTER — Telehealth: Payer: Self-pay

## 2023-01-22 NOTE — Telephone Encounter (Signed)
 Dana Koch (KeyBETHA CANARY) PA Case ID #: [id number] Rx #: [id number] Need Help? Call us  at [phone] Status sent iconSent to Plan today Drug Repatha  SureClick 140MG /ML auto-injectors ePA cloud logo Form [employer] Medicare Part D Electronic Prior Authorization Form [id number] NCPDP) Original Claim Info [id number]

## 2023-01-22 NOTE — Telephone Encounter (Signed)
 Pharmacy Patient Advocate Encounter  Received notification from [facility] that Prior Authorization for Repatha SureClick 140MG /ML auto-injectors has been APPROVED from 01/22/23 to 01/15/24   PA #/Case ID/Reference #:  [id number]

## 2023-02-20 ENCOUNTER — Telehealth: Payer: Self-pay | Admitting: Family Medicine

## 2023-02-20 NOTE — Telephone Encounter (Signed)
 Please advise.   Copied from CRM [id number]. Topic: Clinical - Medical Advice >> Feb 20, 2023 12:10 PM Delon DASEN wrote: Reason for CRM: Patient is asking Marry to take over endocrine care, Dr. Claudene agrees to allow it since no changes in medication for quite some time, please call patient [phone]

## 2023-02-21 NOTE — Telephone Encounter (Signed)
 Patient informed. LS

## 2023-03-07 ENCOUNTER — Ambulatory Visit: Payer: 59 | Admitting: Family Medicine

## 2023-03-14 ENCOUNTER — Encounter: Payer: Self-pay | Admitting: Family Medicine

## 2023-03-14 ENCOUNTER — Ambulatory Visit (INDEPENDENT_AMBULATORY_CARE_PROVIDER_SITE_OTHER): Payer: 59 | Admitting: Family Medicine

## 2023-03-14 VITALS — BP 116/60 | HR 86 | Temp 98.2°F | Ht 75.0 in | Wt 350.0 lb

## 2023-03-14 DIAGNOSIS — I10 Essential (primary) hypertension: Secondary | ICD-10-CM | POA: Diagnosis not present

## 2023-03-14 DIAGNOSIS — E559 Vitamin D deficiency, unspecified: Secondary | ICD-10-CM | POA: Diagnosis not present

## 2023-03-14 DIAGNOSIS — Z8639 Personal history of other endocrine, nutritional and metabolic disease: Secondary | ICD-10-CM | POA: Diagnosis not present

## 2023-03-14 DIAGNOSIS — E785 Hyperlipidemia, unspecified: Secondary | ICD-10-CM

## 2023-03-14 DIAGNOSIS — E039 Hypothyroidism, unspecified: Secondary | ICD-10-CM

## 2023-03-14 DIAGNOSIS — Z87891 Personal history of nicotine dependence: Secondary | ICD-10-CM

## 2023-03-14 DIAGNOSIS — I890 Lymphedema, not elsewhere classified: Secondary | ICD-10-CM | POA: Diagnosis not present

## 2023-03-14 DIAGNOSIS — D472 Monoclonal gammopathy: Secondary | ICD-10-CM

## 2023-03-14 DIAGNOSIS — R739 Hyperglycemia, unspecified: Secondary | ICD-10-CM | POA: Diagnosis not present

## 2023-03-14 DIAGNOSIS — R7303 Prediabetes: Secondary | ICD-10-CM | POA: Diagnosis not present

## 2023-03-14 DIAGNOSIS — M79604 Pain in right leg: Secondary | ICD-10-CM

## 2023-03-14 MED ORDER — LEVOTHYROXINE SODIUM 200 MCG PO TABS: 200.0000 ug | ORAL_TABLET | Freq: Every day | ORAL | 0 refills | Status: DC

## 2023-03-14 MED ORDER — LIOTHYRONINE SODIUM 5 MCG PO TABS: 5.0000 ug | ORAL_TABLET | Freq: Every day | ORAL | 1 refills | Status: AC

## 2023-03-14 MED ORDER — HYDROCHLOROTHIAZIDE 25 MG PO TABS: 25.0000 mg | ORAL_TABLET | Freq: Every day | ORAL | 2 refills | Status: AC

## 2023-03-14 MED ORDER — REPATHA SURECLICK 140 MG/ML ~~LOC~~ SOAJ: 140.0000 mg | SUBCUTANEOUS | 5 refills | Status: AC

## 2023-03-14 MED ORDER — EZETIMIBE 10 MG PO TABS: 10.0000 mg | ORAL_TABLET | Freq: Every day | ORAL | 2 refills | Status: AC

## 2023-03-14 NOTE — Progress Notes (Signed)
 Subjective:  Patient ID: Dana Koch, female    DOB: 02-19-1955, 68 y.o.   MRN: [id number]  Patient Care Team: Ellamae Sia Aleen Campi, FNP as PCP - General (Family Medicine)   Chief Complaint:  Medical Management of Chronic Issues (6 month follow up)  HPI: Dana Koch is a 68 y.o. female presenting on 03/14/2023 for Medical Management of Chronic Issues (6 month follow up) Has dog named Ellie  HPI 1. Primary hypertension Has BP monitor at home Yes BP at home average - not checking  ROS Denies anxiety, fatigue, peripheral edema, changes to vision, chest pain, headaches, palpitations, sweats, SOB, PND, orthopnea Meds hydrochlorothiazide - treating   CAD risks hypertension, hypercholesterolemia/hyperlipidemia  2. Acquired hypothyroidism Thyroid: Patient presents for evaluation of hypothyroidism. Current symptoms include denies fatigue, weight changes, heat/cold intolerance, bowel/skin changes or CVS symptoms.   3. Dyslipidemia, goal LDL below 100 Lipid/Cholesterol, Follow-up  Last lipid panel Other pertinent labs  Lab Results  Component Value Date   CHOL 147 09/04/2022   HDL 61 09/04/2022   LDLCALC 57 09/04/2022   TRIG 179 (H) 09/04/2022   CHOLHDL 2.4 09/04/2022   Lab Results  Component Value Date   ALT 16 11/22/2022   AST 17 11/22/2022   PLT 253 11/22/2022   TSH 3.190 12/26/2021     She was last seen for this 6 months ago.  Management since that visit includes zetia and repatha   She reports excellent compliance with treatment. She is not having side effects.   Symptoms: No chest pain No chest pressure/discomfort  No dyspnea No lower extremity edema  No numbness or tingling of extremity No orthopnea  No palpitations No paroxysmal nocturnal dyspnea  No speech difficulty No syncope   Current diet:  states that she tries not to fry anything, uses olive oil. Using AirFryer. Avoids sweets.   Current exercise: none  The 10-year ASCVD risk score (Arnett DK, et  al., 2019) is: 10.7%  ---------------------------------------------------------------------------------------------------  4. Morbid obesity (HCC) States that she has not started exercise due to right leg pain. She is making some diet changes.   5. Right Leg Pain States that right groin started a few months ago and continue to cause her pain. She is taking ibuprofen and tylenol 4 times per day. Does not wish to go to PT.   6. Lymphedema  Has had swelling in her left lower leg since injury in 1990. She was previously established with [facility], but has not followed up.   7. Former Tobacco  Quit ~20 years ago  Smoked for 20 years  Smoked 4 packs per day at one point   8. MGUS States that she has possible cancer and is planning to follow up with Hemat/Onc with [facility], Dr. Jama Flavors.   Relevant past medical, surgical, family, and social history reviewed and updated as indicated.  Allergies and medications reviewed and updated. Data reviewed: Chart in Epic.   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   Neuropathy    Prediabetes    Venous stasis     Past Surgical History:  Procedure Laterality Date  CHOLECYSTECTOMY  01/16/1987   LEG SURGERY Left    broken left leg   TOTAL ABDOMINAL HYSTERECTOMY  2020    Social History   Socioeconomic History   Marital status: Legally Separated    Spouse name: Not on file   Number of children: Not on file   Years of education: Not on file   Highest education level: 12th grade  Occupational History   Not on file  Tobacco Use   Smoking status: Former    Current packs/day: 0.00     Average packs/day: 4.0 packs/day for 40.0 years (159.9 ttl pk-yrs)    Types: Cigarettes    Start date: 12/04/1972    Quit date: 01/04/2003    Years since quitting: 20.2   Smokeless tobacco: Never  Vaping Use   Vaping status: Never Used  Substance and Sexual Activity   Alcohol use: No    Alcohol/week: 0.0 standard drinks of alcohol   Drug use: No   Sexual activity: Not Currently    Birth control/protection: Post-menopausal  Other Topics Concern   Not on file  Social History Narrative   Not on file   Social Drivers of Health   Financial Resource Strain: Low Risk  (10/24/2022)   Overall Financial Resource Strain (CARDIA)    Difficulty of Paying Living Expenses: Not very hard  Food Insecurity: No Food Insecurity (10/24/2022)   Hunger Vital Sign    Worried About Running Out of Food in the Last Year: Never true    Ran Out of Food in the Last Year: Never true  Transportation Needs: No Transportation Needs (10/24/2022)   PRAPARE - Administrator, Civil Service (Medical): No    Lack of Transportation (Non-Medical): No  Physical Activity: Inactive (10/24/2022)   Exercise Vital Sign    Days of Exercise per Week: 0 days    Minutes of Exercise per Session: 0 min  Stress: No Stress Concern Present (10/24/2022)   [employer] of Occupational Health - Occupational Stress Questionnaire    Feeling of Stress : Only a little  Social Connections: Socially Isolated (10/24/2022)   Social Connection and Isolation Panel [NHANES]    Frequency of Communication with Friends and Family: Once a week    Frequency of Social Gatherings with Friends and Family: Once a week    Attends Religious Services: Never    Database administrator or Organizations: No    Attends Banker Meetings: Never    Marital Status: Separated  Intimate Partner Violence: Not At Risk (10/24/2022)   Humiliation, Afraid, Rape, and Kick questionnaire    Fear of Current or Ex-Partner: No    Emotionally  Abused: No    Physically Abused: No    Sexually Abused: No    Outpatient Encounter Medications as of 03/14/2023  Medication Sig   acetaminophen (TYLENOL) 500 MG tablet Take 1,000 mg by mouth every 8 (eight) hours as needed for moderate pain.   Cyanocobalamin (B-12) 1000 MCG CAPS Take by mouth.   hydrochlorothiazide (HYDRODIURIL) 25 MG tablet Take 1 tablet (25 mg total) by mouth daily.   ibuprofen (ADVIL) 200 MG tablet Take 200 mg by mouth every 8 (eight) hours as needed for moderate pain.   levothyroxine (SYNTHROID) 200 MCG tablet TAKE ONE TABLET BY MOUTH DAILY   liothyronine (CYTOMEL) 5 MCG tablet Take 5 mcg by mouth daily.   REPATHA SURECLICK 140 MG/ML SOAJ INJECT 140 MG into THE SKIN EVERY 14 DAYS   ezetimibe (ZETIA) 10 MG tablet  Take 1 tablet (10 mg total) by mouth daily.   No facility-administered encounter medications on file as of 03/14/2023.    Allergies  Allergen Reactions   Meperidine Other (See Comments)    Stops my heart   Statins Other (See Comments) and Shortness Of Breath    sob   Amoxicillin Hives   Red Yeast Rice [Cholestin] Itching    Review of Systems As per HPI  Objective:  BP 116/60   Pulse 86   Temp 98.2 F (36.8 C)   Ht 6\' 3"  (1.905 m)   Wt (!) 350 lb (158.8 kg)   SpO2 92%   BMI 43.75 kg/m    Wt Readings from Last 3 Encounters:  03/14/23 (!) 350 lb (158.8 kg)  11/22/22 (!) 350 lb (158.8 kg)  10/24/22 (!) 347 lb (157.4 kg)    Physical Exam Constitutional:      General: She is awake. She is not in acute distress.    Appearance: Normal appearance. She is well-developed and well-groomed. She is morbidly obese. She is not ill-appearing, toxic-appearing or diaphoretic.  Cardiovascular:     Rate and Rhythm: Normal rate and regular rhythm.     Pulses: Normal pulses.          Radial pulses are 2+ on the right side and 2+ on the left side.       Posterior tibial pulses are 2+ on the right side and 2+ on the left side.     Heart sounds: Normal heart  sounds. No murmur heard.    No gallop.  Pulmonary:     Effort: Pulmonary effort is normal. No respiratory distress.     Breath sounds: Normal breath sounds. No stridor. No wheezing, rhonchi or rales.  Musculoskeletal:     Cervical back: Full passive range of motion without pain and neck supple.     Right lower leg: No edema.     Left lower leg: 1+ Edema present.     Comments: Walks with cane   Skin:    General: Skin is warm.     Capillary Refill: Capillary refill takes less than 2 seconds.  Neurological:     General: No focal deficit present.     Mental Status: She is alert, oriented to person, place, and time and easily aroused. Mental status is at baseline.     GCS: GCS eye subscore is 4. GCS verbal subscore is 5. GCS motor subscore is 6.     Motor: No weakness.  Psychiatric:        Attention and Perception: Attention and perception normal.        Mood and Affect: Mood and affect normal.        Speech: Speech normal.        Behavior: Behavior normal. Behavior is cooperative.        Thought Content: Thought content normal. Thought content does not include homicidal or suicidal ideation. Thought content does not include homicidal or suicidal plan.        Cognition and Memory: Cognition and memory normal.        Judgment: Judgment normal.    Results for orders placed or performed in visit on 11/22/22  CBC with Differential/Platelet   Collection Time: 11/22/22 12:39 PM  Result Value Ref Range   WBC 6.8 3.4 - 10.8 x10E3/uL   RBC 4.20 3.77 - 5.28 x10E6/uL   Hemoglobin 12.2 11.1 - 15.9 g/dL   Hematocrit 16.1 09.6 - 46.6 %   MCV 92  79 - 97 fL   MCH 29.0 26.6 - 33.0 pg   MCHC 31.7 31.5 - 35.7 g/dL   RDW 16.1 09.6 - 04.5 %   Platelets 253 150 - 450 x10E3/uL   Neutrophils 57 Not Estab. %   Lymphs 32 Not Estab. %   Monocytes 6 Not Estab. %   Eos 3 Not Estab. %   Basos 1 Not Estab. %   Neutrophils Absolute 3.9 1.4 - 7.0 x10E3/uL   Lymphocytes Absolute 2.2 0.7 - 3.1 x10E3/uL    Monocytes Absolute 0.4 0.1 - 0.9 x10E3/uL   EOS (ABSOLUTE) 0.2 0.0 - 0.4 x10E3/uL   Basophils Absolute 0.1 0.0 - 0.2 x10E3/uL   Immature Granulocytes 1 Not Estab. %   Immature Grans (Abs) 0.0 0.0 - 0.1 x10E3/uL  CMP14+EGFR   Collection Time: 11/22/22 12:39 PM  Result Value Ref Range   Glucose 105 (H) 70 - 99 mg/dL   BUN 13 8 - 27 mg/dL   Creatinine, Ser 4.09 0.57 - 1.00 mg/dL   eGFR 91 >81 XB/JYN/8.29   BUN/Creatinine Ratio 18 12 - 28   Sodium 141 134 - 144 mmol/L   Potassium 4.1 3.5 - 5.2 mmol/L   Chloride 99 96 - 106 mmol/L   CO2 25 20 - 29 mmol/L   Calcium 9.9 8.7 - 10.3 mg/dL   Total Protein 7.7 6.0 - 8.5 g/dL   Albumin 4.6 3.9 - 4.9 g/dL   Globulin, Total 3.1 1.5 - 4.5 g/dL   Bilirubin Total 0.3 0.0 - 1.2 mg/dL   Alkaline Phosphatase 97 44 - 121 IU/L   AST 17 0 - 40 IU/L   ALT 16 0 - 32 IU/L  VITAMIN D 25 Hydroxy (Vit-D Deficiency, Fractures)   Collection Time: 11/22/22 12:39 PM  Result Value Ref Range   Vit D, 25-Hydroxy 7.9 (L) 30.0 - 100.0 ng/mL  Sedimentation Rate   Collection Time: 11/22/22 12:39 PM  Result Value Ref Range   Sed Rate 32 0 - 40 mm/hr  C-reactive protein   Collection Time: 11/22/22 12:39 PM  Result Value Ref Range   CRP 2 0 - 10 mg/L  Phosphorus   Collection Time: 11/22/22 12:39 PM  Result Value Ref Range   Phosphorus 3.3 3.0 - 4.3 mg/dL  Microscopic Examination   Collection Time: 11/22/22 12:41 PM   Urine  Result Value Ref Range   WBC, UA 0-5 0 - 5 /hpf   RBC, Urine None seen 0 - 2 /hpf   Epithelial Cells (non renal) 0-10 0 - 10 /hpf   Renal Epithel, UA None seen None seen /hpf   Bacteria, UA Few (A) None seen/Few   Yeast, UA None seen None seen  Urinalysis, Routine w reflex microscopic   Collection Time: 11/22/22 12:41 PM  Result Value Ref Range   Specific Gravity, UA 1.025 1.005 - 1.030   pH, UA 5.5 5.0 - 7.5   Color, UA Yellow Yellow   Appearance Ur Clear Clear   Leukocytes,UA Negative Negative   Protein,UA Negative  Negative/Trace   Glucose, UA Negative Negative   Ketones, UA Trace (A) Negative   RBC, UA Trace (A) Negative   Bilirubin, UA Negative Negative   Urobilinogen, Ur 0.2 0.2 - 1.0 mg/dL   Nitrite, UA Negative Negative   Microscopic Examination See below:        03/14/2023   10:17 AM 11/22/2022   12:16 PM 10/24/2022    9:57 AM 09/04/2022   10:02 AM 05/29/2022  9:51 AM  Depression screen PHQ 2/9  Decreased Interest 0 0 1 0 0  Down, Depressed, Hopeless 0 0 0 0 0  PHQ - 2 Score 0 0 1 0 0  Altered sleeping 0 0 0 0   Tired, decreased energy 2 0 1 1   Change in appetite 1 1 2 1    Feeling bad or failure about yourself  0 0 0 0   Trouble concentrating 0 0 0 0   Moving slowly or fidgety/restless 0 0 0 0   Suicidal thoughts 0 0 0 0   PHQ-9 Score 3 1 4 2    Difficult doing work/chores Not difficult at all Somewhat difficult Somewhat difficult Not difficult at all        03/14/2023   10:20 AM 11/22/2022   12:17 PM 10/24/2022   10:09 AM 09/04/2022   10:02 AM  GAD 7 : Generalized Anxiety Score  Nervous, Anxious, on Edge 0 2 1 0  Control/stop worrying 0 0 0 0  Worry too much - different things 0 0 0 0  Trouble relaxing 0 0 2 2  Restless 0 0 3 0  Easily annoyed or irritable 0 0 0 2  Afraid - awful might happen 0 0 0 0  Total GAD 7 Score 0 2 6 4   Anxiety Difficulty  Not difficult at all  Not difficult at all   Pertinent labs & imaging results that were available during my care of the patient were reviewed by me and considered in my medical decision making.  Assessment & Plan:  Pattye was seen today for medical management of chronic issues.  Diagnoses and all orders for this visit: 1. Primary hypertension (Primary) Well controlled. Continue current regimen. Labs as below. Will communicate results to patient once available. Will await results to determine next steps.  - CBC with Differential/Platelet - CMP14+EGFR - hydrochlorothiazide (HYDRODIURIL) 25 MG tablet; Take 1 tablet (25 mg total)  by mouth daily.  Dispense: 90 tablet; Refill: 2  2. Acquired hypothyroidism Formerly established with endocrinology. Will transfer care to PCP. Labs as below. Will communicate results to patient once available. Will await results to determine next steps.  Continue current regimen.  - Thyroid Panel With TSH - levothyroxine (SYNTHROID) 200 MCG tablet; Take 1 tablet (200 mcg total) by mouth daily.  Dispense: 90 tablet; Refill: 0 - liothyronine (CYTOMEL) 5 MCG tablet; Take 1 tablet (5 mcg total) by mouth daily.  Dispense: 90 tablet; Refill: 1  3. Dyslipidemia, goal LDL below 100 Reviewed prior ASCVD risk score. Labs as below. Will communicate results to patient once available. Will await results to determine next steps.  Continue current regimen.  - Lipid panel - ezetimibe (ZETIA) 10 MG tablet; Take 1 tablet (10 mg total) by mouth daily.  Dispense: 90 tablet; Refill: 2 - Evolocumab (REPATHA SURECLICK) 140 MG/ML SOAJ; Inject 140 mg into the skin every 14 (fourteen) days.  Dispense: 2 mL; Refill: 5  4. Morbid obesity (HCC) Discussed with patient to continue healthy lifestyle choices, including diet (rich in fruits, vegetables, and lean proteins, and low in salt and simple carbohydrates) and exercise (at least 30 minutes of moderate physical activity daily). Limit beverages high is sugar. Recommended at least 80-100 oz of water daily.   5. Prediabetes Labs as below. Will communicate results to patient once available. Will await results to determine next steps.  - Hgb A1c w/o eAG  6. Personal history of other endocrine, nutritional and metabolic disease Labs as below.  Will communicate results to patient once available. Will await results to determine next steps.  - VITAMIN D 25 Hydroxy (Vit-D Deficiency, Fractures) - Vitamin B12 - Hgb A1c w/o eAG  7. Former smoker Patient stated that she quit smoking over 20 years ago. Therefore, screening not recommended.   8. MGUS (monoclonal gammopathy of  unknown significance) Reviewed notes from [facility], MD 05/08/2022. Patient to follow up with them in the next two months.   9. Lymph edema Reviewed notes from [place], NP 06/30/2020. Patient was supposed to follow up in 6 months. Patient to reach out to specialty for follow up.  - hydrochlorothiazide (HYDRODIURIL) 25 MG tablet; Take 1 tablet (25 mg total) by mouth daily.  Dispense: 90 tablet; Refill: 2  10. Right leg pain Discussed with patient to start stretching, ice/heat, elevation, compression. Patient declined PT at this time.    Continue all other maintenance medications.  Follow up plan: Return in about 3 months (around 06/11/2023) for Chronic Condition Follow up.   Continue healthy lifestyle choices, including diet (rich in fruits, vegetables, and lean proteins, and low in salt and simple carbohydrates) and exercise (at least 30 minutes of moderate physical activity daily).  Written and verbal instructions provided   The above assessment and management plan was discussed with the patient. The patient verbalized understanding of and has agreed to the management plan. Patient is aware to call the clinic if they develop any new symptoms or if symptoms persist or worsen. Patient is aware when to return to the clinic for a follow-up visit. Patient educated on when it is appropriate to go to the emergency department.   Neale Burly, DNP-FNP [facility] [address] [place], [place] [address] [phone]

## 2023-03-14 NOTE — Patient Instructions (Signed)
 Psoas muscle stretching

## 2023-03-15 ENCOUNTER — Encounter: Payer: Self-pay | Admitting: Family Medicine

## 2023-03-15 ENCOUNTER — Telehealth: Payer: Self-pay | Admitting: Family Medicine

## 2023-03-15 LAB — CBC WITH DIFFERENTIAL/PLATELET
Basophils Absolute: 0 10*3/uL (ref 0.0–0.2)
Basos: 1 %
EOS (ABSOLUTE): 0.2 10*3/uL (ref 0.0–0.4)
Eos: 3 %
Hematocrit: 37.6 % (ref 34.0–46.6)
Hemoglobin: 12.3 g/dL (ref 11.1–15.9)
Immature Grans (Abs): 0.1 10*3/uL (ref 0.0–0.1)
Immature Granulocytes: 1 %
Lymphocytes Absolute: 2.2 10*3/uL (ref 0.7–3.1)
Lymphs: 33 %
MCH: 30 pg (ref 26.6–33.0)
MCHC: 32.7 g/dL (ref 31.5–35.7)
MCV: 92 fL (ref 79–97)
Monocytes Absolute: 0.5 10*3/uL (ref 0.1–0.9)
Monocytes: 7 %
Neutrophils Absolute: 3.9 10*3/uL (ref 1.4–7.0)
Neutrophils: 55 %
Platelets: 253 10*3/uL (ref 150–450)
RBC: 4.1 x10E6/uL (ref 3.77–5.28)
RDW: 12.5 % (ref 11.7–15.4)
WBC: 6.8 10*3/uL (ref 3.4–10.8)

## 2023-03-15 LAB — CMP14+EGFR
ALT: 17 [IU]/L (ref 0–32)
AST: 20 [IU]/L (ref 0–40)
Albumin: 4.6 g/dL (ref 3.9–4.9)
Alkaline Phosphatase: 85 [IU]/L (ref 44–121)
BUN/Creatinine Ratio: 19 (ref 12–28)
BUN: 13 mg/dL (ref 8–27)
Bilirubin Total: 0.2 mg/dL (ref 0.0–1.2)
CO2: 26 mmol/L (ref 20–29)
Calcium: 10 mg/dL (ref 8.7–10.3)
Chloride: 98 mmol/L (ref 96–106)
Creatinine, Ser: 0.68 mg/dL (ref 0.57–1.00)
Globulin, Total: 2.8 g/dL (ref 1.5–4.5)
Glucose: 97 mg/dL (ref 70–99)
Potassium: 4 mmol/L (ref 3.5–5.2)
Sodium: 139 mmol/L (ref 134–144)
Total Protein: 7.4 g/dL (ref 6.0–8.5)
eGFR: 96 mL/min/{1.73_m2} (ref >59)

## 2023-03-15 LAB — VITAMIN B12: Vitamin B-12: 746 pg/mL (ref 232–1245)

## 2023-03-15 LAB — THYROID PANEL WITH TSH
Free Thyroxine Index: 3.4 (ref 1.2–4.9)
T3 Uptake Ratio: 29 % (ref 24–39)
T4, Total: 11.7 ug/dL (ref 4.5–12.0)
TSH: 1.35 u[IU]/mL (ref 0.450–4.500)

## 2023-03-15 LAB — LIPID PANEL
Chol/HDL Ratio: 2.2 {ratio} (ref 0.0–4.4)
Cholesterol, Total: 139 mg/dL (ref 100–199)
HDL: 63 mg/dL (ref >39)
LDL Chol Calc (NIH): 47 mg/dL (ref 0–99)
Triglycerides: 179 mg/dL — ABNORMAL HIGH (ref 0–149)
VLDL Cholesterol Cal: 29 mg/dL (ref 5–40)

## 2023-03-15 LAB — SPECIMEN STATUS REPORT

## 2023-03-15 LAB — VITAMIN D 25 HYDROXY (VIT D DEFICIENCY, FRACTURES): Vit D, 25-Hydroxy: 14.1 ng/mL — ABNORMAL LOW (ref 30.0–100.0)

## 2023-03-15 LAB — HGB A1C W/O EAG: Hgb A1c MFr Bld: 6 % — ABNORMAL HIGH (ref 4.8–5.6)

## 2023-03-15 MED ORDER — VITAMIN D (ERGOCALCIFEROL) 1.25 MG (50000 UNIT) PO CAPS: 50000.0000 [IU] | ORAL_CAPSULE | ORAL | 0 refills | Status: DC

## 2023-03-15 NOTE — Telephone Encounter (Signed)
 Copied from CRM [id number]. Topic: Clinical - Prescription Issue >> Mar 15, 2023  4:34 PM Fuller Mandril wrote: Reason for CRM: Patient called states went to pharmacy - insurance will not cover Vitamin D, Ergocalciferol, (DRISDOL) 1.25 MG (50000 UNIT) CAPS capsule. States its over $40 and also they will not have available until next week. Would like to know if something else can be sent in. Thank You

## 2023-03-15 NOTE — Addendum Note (Signed)
 Addended by: Neale Burly on: 03/15/2023 09:44 AM   Modules accepted: Orders

## 2023-03-15 NOTE — Progress Notes (Signed)
 Cholesterol slightly improved. Continue diet and lifestyle modifications. Continue current medications. Would like to get true fasting in the future. The 10-year ASCVD risk score (Arnett DK, et al., 2019) is: 10.3%. Vitamin D level is low. I have sent in a weekly supplement to take for the next 12 weeks. After that, take a daily OTC vitamin D supplement with 1000-2000 IU.

## 2023-03-20 NOTE — Progress Notes (Signed)
 A1C remains well controlled.

## 2023-03-29 ENCOUNTER — Encounter: Payer: Self-pay | Admitting: Family Medicine

## 2023-03-29 DIAGNOSIS — R2242 Localized swelling, mass and lump, left lower limb: Secondary | ICD-10-CM

## 2023-03-29 DIAGNOSIS — M79604 Pain in right leg: Secondary | ICD-10-CM

## 2023-03-29 DIAGNOSIS — M25561 Pain in right knee: Secondary | ICD-10-CM

## 2023-04-16 ENCOUNTER — Ambulatory Visit

## 2023-04-30 ENCOUNTER — Ambulatory Visit

## 2023-05-02 ENCOUNTER — Telehealth: Payer: Self-pay | Admitting: Family Medicine

## 2023-05-02 NOTE — Telephone Encounter (Signed)
 Copied from CRM [id number]. Topic: General - Other >> May 02, 2023  8:19 AM Allyne Areola wrote: Reason for CRM: Patient would like to speak with [facility] or the phlebotomist at the office regarding lab work her oncologist wants her to do. She would like for the office to giver her a call to discuss the lab work.

## 2023-05-02 NOTE — Telephone Encounter (Signed)
 Explained to patient that we just the orders. The oncologist can put in Epic, give paper copy to pt or fax to our office. Main fax number given to pt. She will call her oncologist in [place] to see how they want to proceed. Pt is aware that she will need to schedule a lab appt with us .

## 2023-05-09 ENCOUNTER — Other Ambulatory Visit

## 2023-05-13 DIAGNOSIS — D472 Monoclonal gammopathy: Secondary | ICD-10-CM | POA: Diagnosis not present

## 2023-05-15 ENCOUNTER — Telehealth: Payer: Self-pay | Admitting: Family Medicine

## 2023-05-15 NOTE — Telephone Encounter (Signed)
 Pt made aware and understood. Confirmed appt with Connell Degree. Pt has no further concerns.

## 2023-05-15 NOTE — Telephone Encounter (Signed)
 Yes that is fine, have her still see Connell Degree this next time because she already has an appointment there and she can schedule it with me for the time after that.

## 2023-05-15 NOTE — Telephone Encounter (Signed)
 Will Dr Koch take on pt?  Copied from CRM [id number]. Topic: General - Other >> May 15, 2023  2:32 PM Dana Koch wrote: Reason for CRM: The patient states since Dana Koch is leaving 07/12/2023 from [facility], can she become a patient of Dana Koch? She states she was previously under his care a few years ago and would like Dana Koch to be her PCP.

## 2023-06-04 NOTE — Telephone Encounter (Signed)
 I tried calling pt to get her scheduled for a 30 min OV to re-est care with Dr Dettinger but pt did not answer the phone and no voicemail option.

## 2023-06-11 ENCOUNTER — Telehealth: Payer: Self-pay | Admitting: Family Medicine

## 2023-06-11 ENCOUNTER — Ambulatory Visit: Payer: 59 | Admitting: Family Medicine

## 2023-06-11 NOTE — Telephone Encounter (Signed)
 Copied from CRM [id number]. Topic: Complaint (DO NOT CONVERT) - Provider (non-sensitive) >> Jun 11, 2023  9:07 AM Tiffany B wrote: Patient states [facility] phone tree estimated time frame is not accurate. Caller states the phone tree will say she is next in line and there's 1 minute left but she was on hold for 5 to 7 minutes.

## 2023-06-12 ENCOUNTER — Other Ambulatory Visit: Payer: Self-pay | Admitting: Family Medicine

## 2023-06-12 DIAGNOSIS — E039 Hypothyroidism, unspecified: Secondary | ICD-10-CM

## 2023-06-12 DIAGNOSIS — E559 Vitamin D deficiency, unspecified: Secondary | ICD-10-CM

## 2023-06-21 ENCOUNTER — Telehealth: Payer: Self-pay | Admitting: Family Medicine

## 2023-06-27 ENCOUNTER — Encounter: Payer: Self-pay | Admitting: Family Medicine

## 2023-06-27 ENCOUNTER — Ambulatory Visit: Admitting: Family Medicine

## 2023-06-27 VITALS — BP 136/79 | HR 79 | Temp 98.4°F | Ht 75.0 in | Wt 357.4 lb

## 2023-06-27 DIAGNOSIS — G4489 Other headache syndrome: Secondary | ICD-10-CM

## 2023-06-27 DIAGNOSIS — R413 Other amnesia: Secondary | ICD-10-CM
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
## 2023-07-02 ENCOUNTER — Telehealth: Payer: Self-pay | Admitting: Family Medicine

## 2023-07-02 NOTE — Telephone Encounter (Signed)
 Yes, per the last CRM, patient is wanting to transfer her records from [facility] to [place]. I will mail the record release form to her, if she doesn't feel comfortable coming by the office to sign it.  Copied from CRM [id number]. Topic: General - Other >> Jul 02, 2023  2:01 PM Turkey B wrote: Reason for CRM: pt called in , states received message for her to come in or make appt for her to sign form,not sure what it's for, thinking it for release of med records. She states she doesn't feel comfortable coming ot the office and asking for the form to be mailed.

## 2023-07-04 NOTE — Telephone Encounter (Signed)
 Pt called in response to her medical records and where they need to sent. She says that she hasn't gotten another doctor, to send them to her.....Aaron Aas (we can't do that I dont think). Please advise patient.

## 2023-07-04 NOTE — Telephone Encounter (Signed)
 Yes we can mail them to her

## 2023-07-04 NOTE — Telephone Encounter (Signed)
 Records printed and patient called, but no answer

## 2023-07-16 NOTE — Telephone Encounter (Signed)
 Patient wants all calls from [facility] to stop. Please do not call patient anymore. Patient is very upset with [facility]; specifically Dr Maryanne. She says she came in for her appt with him and during the appt he called her ignorant. She just wants her records mailed to her. I attempted to mail the record release form to her on 6/17 (see note) but after speaking with her again, she confirmed that she has not received it in the mail yet. She says she just wants her records so she can take them with her to whatever office she chooses to establish care with next. But per patient, please no more phone calls to her.

## 2023-07-24 ENCOUNTER — Ambulatory Visit: Admitting: Family

## 2023-08-07 ENCOUNTER — Telehealth: Payer: Self-pay

## 2023-08-07 NOTE — Telephone Encounter (Signed)
 [email or web address]

## 2023-08-07 NOTE — Telephone Encounter (Signed)
 Copied from CRM [id number]. Topic: General - Other >> Aug 07, 2023  2:01 PM Antwanette L wrote: Reason for CRM: Patient is former patient of [facility] and [facility] wants to know the last time the patient had a bone density scan. The patient was last seen by Dr. Maryanne on 06/27/23. The patient is requesting a call back at [phone]

## 2023-08-11 IMAGING — DX DG FOOT COMPLETE 3+V*R*
3 series · 3 of 3 positions shown · non-contrast
Comparison: None.

CLINICAL DATA: Right heel wound

EXAM:
RIGHT FOOT COMPLETE - 3+ VIEW

[foot ap]
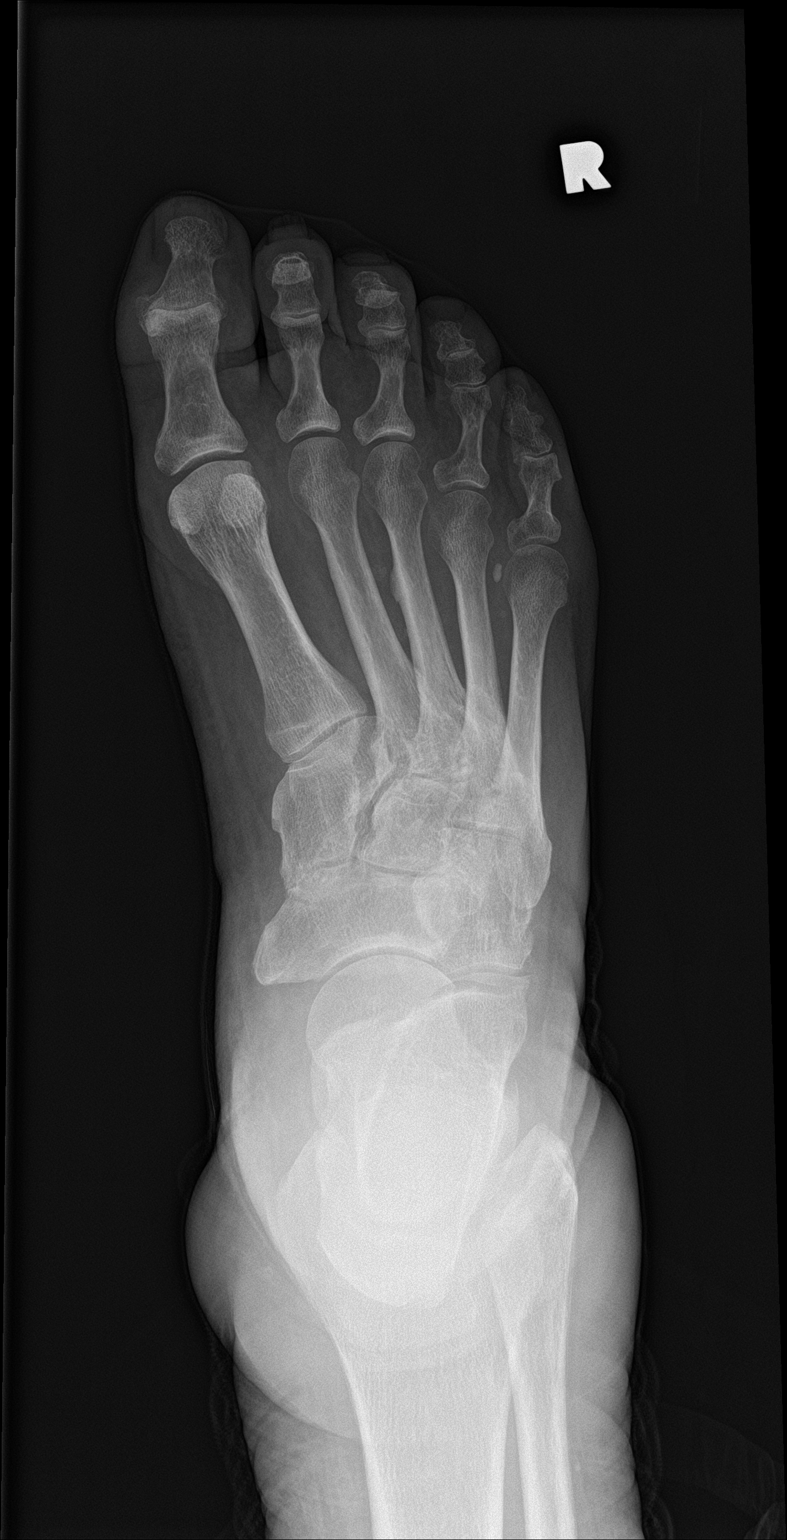

[foot obl]
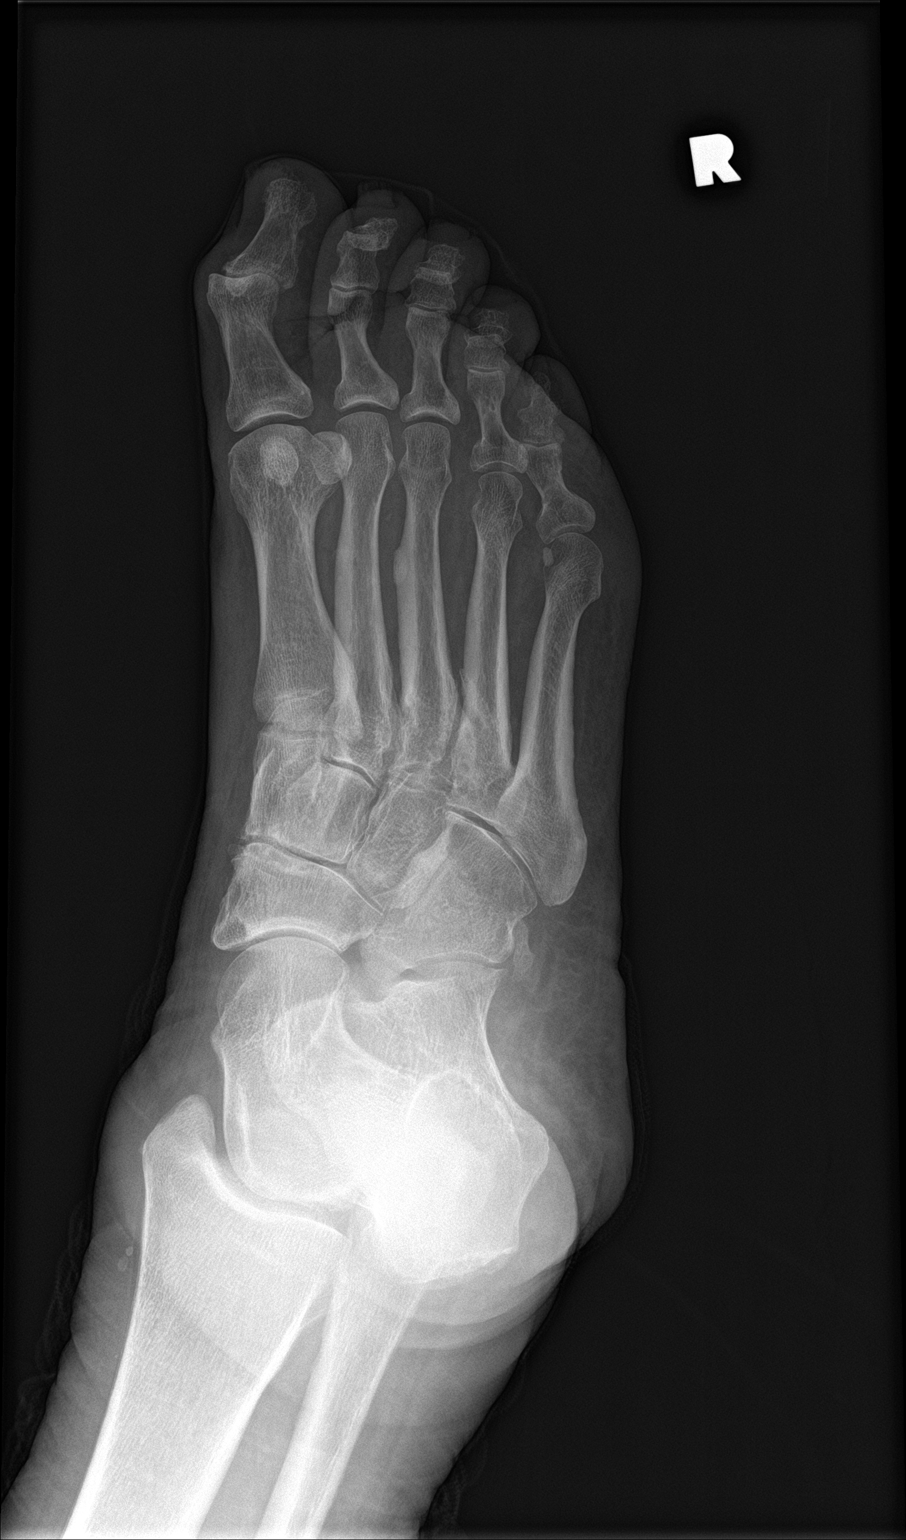

[foot lat]
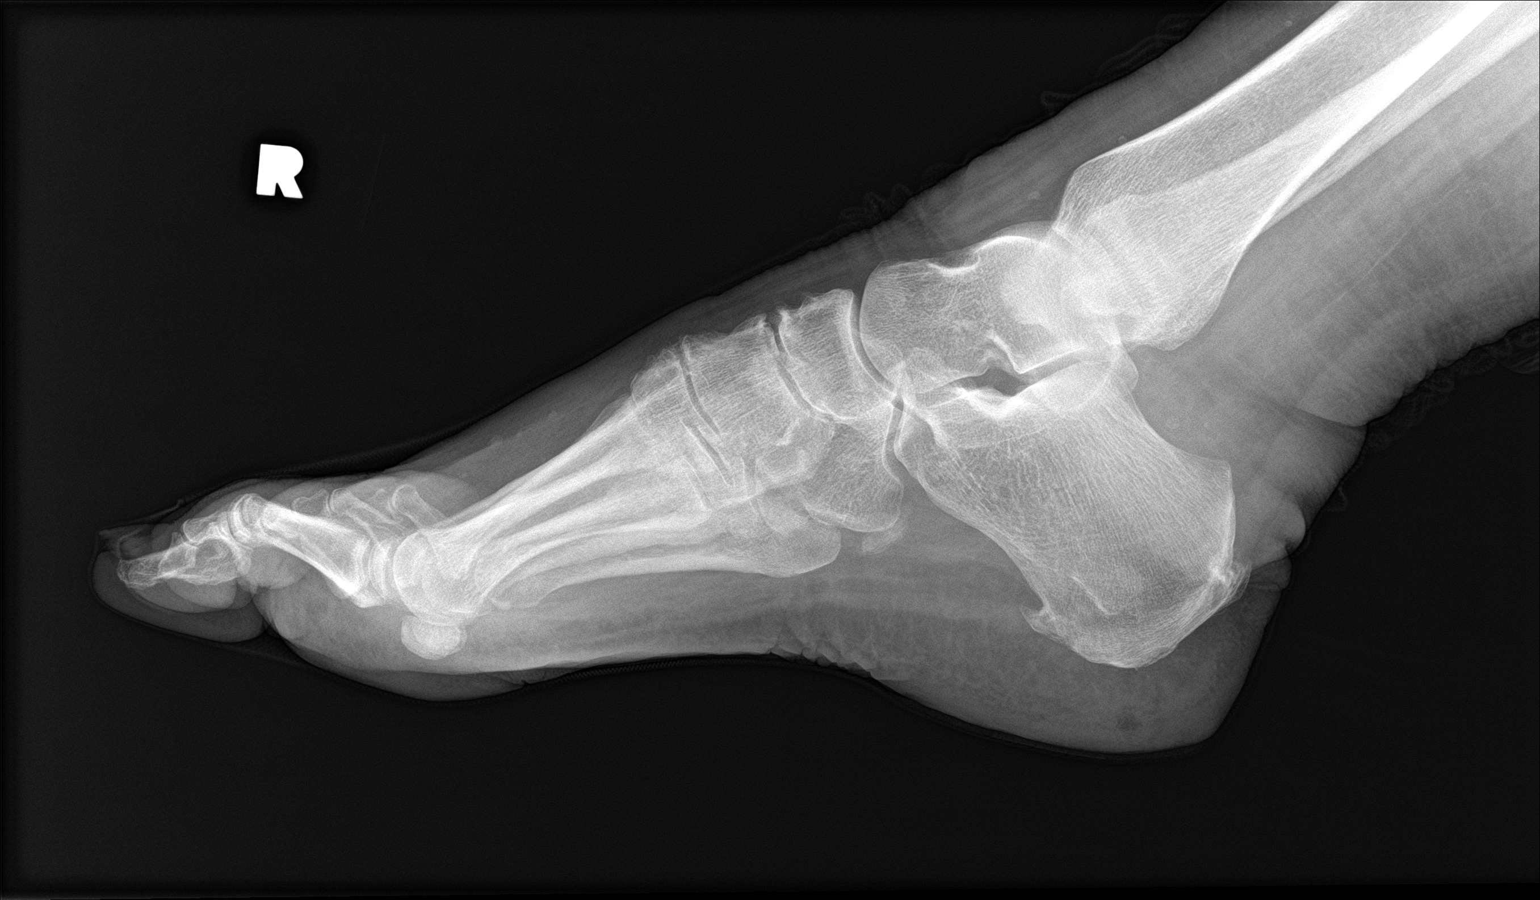

[3 of 3 positions shown; findings below may reference images not displayed]

FINDINGS: No fracture or dislocation is seen. There small faint lucency in the
soft tissues in the plantar aspect of posterior calcaneus. There is
small to moderate sized plantar spur in the calcaneus. There are no
focal lytic lesions in the bony structures. Degenerative changes are
noted with bony spurs in the intertarsal joints seen in the lateral
view, particularly prominent in the articulation between navicular
and medial cuneiform.
IMPRESSION: No recent fracture or dislocation is seen in the right foot. There
are no focal lytic lesions. Small plantar spur is seen in calcaneus.
Degenerative changes with bony spurs seen in the dorsal aspect of
intertarsal joints.

There is faint lucency in the soft tissues along the plantar aspect
of posterior calcaneus. This may suggest small pocket of air in the
soft tissues due to open wound in the skin or suggest infectious
process.

## 2023-08-16 NOTE — Telephone Encounter (Signed)
 Left detailed message per signed DPR. Encouraged call back or send us  a Mychart message if there are any questions.

## 2023-10-03 ENCOUNTER — Ambulatory Visit: Attending: Family Medicine | Admitting: Physical Therapy

## 2023-10-03 ENCOUNTER — Other Ambulatory Visit: Payer: Self-pay

## 2023-10-03 DIAGNOSIS — M25551 Pain in right hip: Secondary | ICD-10-CM | POA: Insufficient documentation

## 2023-10-03 DIAGNOSIS — M6281 Muscle weakness (generalized): Secondary | ICD-10-CM | POA: Diagnosis present

## 2023-10-03 NOTE — Therapy (Signed)
 OUTPATIENT PHYSICAL THERAPY LOWER EXTREMITY EVALUATION   Patient Name: Dana Koch MRN: [id number] DOB:1955-08-11, 68 y.o., female Today's Date: 10/03/2023  END OF SESSION:  PT End of Session - 10/03/23 1125     Visit Number 1    Number of Visits 8    Date for Recertification  11/14/23    PT Start Time 1050    PT Stop Time 1118    PT Time Calculation (min) 28 min    Activity Tolerance Patient tolerated treatment well    Behavior During Therapy [facility] for tasks assessed/performed          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 02/10/2018   [facility] +HPV, will schedule colpo with Dr Edsel    Hyperlipidemia    Hypertension    Hypothyroidism    Lung cancer screening declined by patient 12/26/2021   Lymphedema    Mammogram declined 12/26/2021   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  THERAPY DIAG:  Pain in right hip  Rationale for Evaluation and Treatment: Rehabilitation  ONSET DATE: Few months.  SUBJECTIVE:   SUBJECTIVE STATEMENT: The patient presents to the clinic with c/o right anterior hip/groin pain that occurred a few months ago she thinks is due to stepping of a curb which she has done many times before.  She reports an original many year years.  Certain movements can  produce very high pain-levels.    PERTINENT HISTORY: H/o right hip/groin pain, h/o right knee pain.   PAIN:  Are you having pain? Sore and ache.    PRECAUTIONS: None  RED FLAGS: None   WEIGHT BEARING RESTRICTIONS: No  FALLS:  Has patient fallen in last 6 months? No  LIVING ENVIRONMENT: Lives with: lives alone Lives in: House/apartment Stairs: 1 external.   Has following equipment at home: Cane.  OCCUPATION: Retired.    PLOF: Independent with basic ADLs  PATIENT GOALS: Reduce pain and do more.     OBJECTIVE:   PATIENT SURVEYS:  LEFS:  28/80.  POSTURE: Bilateral genu valgum.   LOWER EXTREMITY ROM:  Right hip active-assistive flexion limited to 80 degrees and IR/ER to 10 degrees.  LOWER EXTREMITY MMT:  Right hip flexion strength limited to 3-/5 at least in part due to pain.    LOWER EXTREMITY SPECIAL TESTS:  Some pain reproduction with a right Hip Scour test.   GAIT: Trendelenburg-type gait pattern.    TRANSITIONS:    Patient used both hand to to lift right LE to treatment table going from sit to supine due to reported pain.  TREATMENT DATE:     PATIENT EDUCATION:  Education details: Discussed findings.   Person educated: Patient Education method: Explanation Education comprehension: verbalized understanding  HOME EXERCISE PROGRAM:   ASSESSMENT:  CLINICAL IMPRESSION: The patient presents to OPPT with c/o a recent flare-up of right hip/groin pain that originated many years ago.  She points to her right anterior hip and groin region.  Her right hip motion is significantly restricted per contralateral comparison.  Her pain seems to be referred in nature.  She did have pain increase with a Hip Scour test.  Her LEFS score is 28/80.    OBJECTIVE IMPAIRMENTS: Abnormal gait, difficulty walking, decreased ROM, decreased strength,  and pain.   ACTIVITY LIMITATIONS: carrying, lifting, bending, and locomotion level  PARTICIPATION LIMITATIONS: meal prep, cleaning, laundry, and community activity  PERSONAL FACTORS: Time since onset of injury/illness/exacerbation and 1 comorbidity: prior injury are also affecting patient's functional outcome.   REHAB POTENTIAL: Good  CLINICAL DECISION MAKING: Evolving/moderate complexity  EVALUATION COMPLEXITY: Moderate   GOALS:  SHORT TERM GOALS: Target date: 10/17/23.  Ind with a HEP. Goal status: INITIAL  Baseline:  Goal status: INITIAL   LONG TERM GOALS: Target date: 11/14/23  Perform ADL's with pain not > 3/10.:  Goal status: INITIAL  2.  Improve right hip flexion strength by one muscle grade. Baseline:  Goal status: INITIAL  3.  Patient able to walk a community distance with pain not > 3/10.  Goal status: INITIAL  4.  Improve LEFS score by at least 5 points.  Goal status: INITIAL    PLAN:  PT FREQUENCY: 2x/week  PT DURATION: 4 weeks  PLANNED INTERVENTIONS: 97110-Therapeutic exercises, 97530- Therapeutic activity, [id number]- Neuromuscular re-education, 97535- Self Care, [id number]- Manual therapy, [id number]- Electrical stimulation (unattended), 97035- Ultrasound, Patient/Family education, Cryotherapy, and Moist heat  PLAN FOR NEXT SESSION: Nustep, right hip ROM and strengthening.     Yahye Siebert, [place], PT 10/03/2023, 11:50 AM

## 2023-10-07 ENCOUNTER — Ambulatory Visit: Admitting: Family Medicine

## 2023-10-08 ENCOUNTER — Encounter

## 2023-10-10 ENCOUNTER — Encounter: Admitting: Physical Therapy

## 2023-10-29 ENCOUNTER — Encounter: Admitting: *Deleted

## 2023-10-31 ENCOUNTER — Encounter: Admitting: *Deleted

## 2023-12-26 ENCOUNTER — Telehealth: Payer: Self-pay | Admitting: Family Medicine

## 2023-12-26 NOTE — Telephone Encounter (Signed)
 Copied from CRM #[id number]. Topic: General - Other >> Dec 26, 2023  9:19 AM Diannia H wrote: Reason for CRM: The patient is calling because she is wanting to speak to the engineer, manufacturing. She only wants to discuss the issue with the engineer, manufacturing. Patient is requesting a callback. Could you give him a callback towards the end of the day. She is really needed to speak to the practice manager because is in distress about what's going on. Could you assist and call the patient at [phone]? She is really upset but not with the clinic.
